# Patient Record
Sex: Male | Born: 1960
Health system: Southern US, Community
[De-identification: ages and names within clinical notes are randomized; demographics above are authoritative.]

## PROBLEM LIST (undated history)

## (undated) DIAGNOSIS — G4730 Sleep apnea, unspecified: Secondary | ICD-10-CM

## (undated) DIAGNOSIS — E079 Disorder of thyroid, unspecified: Secondary | ICD-10-CM

## (undated) DIAGNOSIS — F329 Major depressive disorder, single episode, unspecified: Secondary | ICD-10-CM

## (undated) DIAGNOSIS — F419 Anxiety disorder, unspecified: Secondary | ICD-10-CM

## (undated) DIAGNOSIS — R079 Chest pain, unspecified: Secondary | ICD-10-CM

## (undated) DIAGNOSIS — F32A Depression, unspecified: Secondary | ICD-10-CM

## (undated) HISTORY — DX: Anxiety disorder, unspecified: F41.9

## (undated) HISTORY — DX: Disorder of thyroid, unspecified: E07.9

## (undated) HISTORY — DX: Depression, unspecified: F32.A

## (undated) HISTORY — DX: Chest pain, unspecified: R07.9

## (undated) HISTORY — DX: Major depressive disorder, single episode, unspecified: F32.9

## (undated) HISTORY — DX: Sleep apnea, unspecified: G47.30

---

## 1993-01-30 HISTORY — PX: VARICOSE VEIN SURGERY: SHX832

## 2003-01-31 HISTORY — PX: ENDOSCOPIC VEIN LASER TREATMENT: SHX1508

## 2004-01-31 HISTORY — PX: APPENDECTOMY: SHX54

## 2008-01-31 HISTORY — PX: ELBOW SURGERY: SHX618

## 2009-01-30 HISTORY — PX: SHOULDER SURGERY: SHX246

## 2015-07-31 DEATH — deceased

## 2016-04-06 ENCOUNTER — Other Ambulatory Visit: Payer: Self-pay | Admitting: Physician Assistant

## 2016-04-06 ENCOUNTER — Ambulatory Visit (INDEPENDENT_AMBULATORY_CARE_PROVIDER_SITE_OTHER): Payer: Federal, State, Local not specified - PPO | Admitting: Physician Assistant

## 2016-04-06 ENCOUNTER — Encounter: Payer: Self-pay | Admitting: Physician Assistant

## 2016-04-06 VITALS — BP 122/80 | HR 77 | Temp 97.9°F | Resp 18 | Ht 70.0 in | Wt 270.0 lb

## 2016-04-06 DIAGNOSIS — E559 Vitamin D deficiency, unspecified: Secondary | ICD-10-CM

## 2016-04-06 DIAGNOSIS — E349 Endocrine disorder, unspecified: Secondary | ICD-10-CM

## 2016-04-06 DIAGNOSIS — E039 Hypothyroidism, unspecified: Secondary | ICD-10-CM

## 2016-04-06 DIAGNOSIS — E538 Deficiency of other specified B group vitamins: Secondary | ICD-10-CM

## 2016-04-06 DIAGNOSIS — L719 Rosacea, unspecified: Secondary | ICD-10-CM

## 2016-04-06 DIAGNOSIS — F988 Other specified behavioral and emotional disorders with onset usually occurring in childhood and adolescence: Secondary | ICD-10-CM

## 2016-04-06 DIAGNOSIS — F411 Generalized anxiety disorder: Secondary | ICD-10-CM | POA: Diagnosis not present

## 2016-04-06 DIAGNOSIS — Z7689 Persons encountering health services in other specified circumstances: Secondary | ICD-10-CM

## 2016-04-06 LAB — TSH: TSH: 2.66 mIU/L (ref 0.40–4.50)

## 2016-04-06 LAB — CBC WITH DIFFERENTIAL/PLATELET
Basophils Absolute: 180 cells/uL (ref 0–200)
Basophils Relative: 3 %
Eosinophils Absolute: 240 cells/uL (ref 15–500)
Eosinophils Relative: 4 %
HCT: 46.8 % (ref 38.5–50.0)
Hemoglobin: 15.8 g/dL (ref 13.0–17.0)
Lymphocytes Relative: 50 %
Lymphs Abs: 3000 cells/uL (ref 850–3900)
MCH: 30.9 pg (ref 27.0–33.0)
MCHC: 33.8 g/dL (ref 32.0–36.0)
MCV: 91.6 fL (ref 80.0–100.0)
MPV: 11.1 fL (ref 7.5–12.5)
Monocytes Absolute: 660 cells/uL (ref 200–950)
Monocytes Relative: 11 %
Neutro Abs: 1920 cells/uL (ref 1500–7800)
Neutrophils Relative %: 32 %
Platelets: 230 10*3/uL (ref 140–400)
RBC: 5.11 MIL/uL (ref 4.20–5.80)
RDW: 15 % (ref 11.0–15.0)
WBC: 6 10*3/uL (ref 3.8–10.8)

## 2016-04-06 LAB — VITAMIN B12: Vitamin B-12: 511 pg/mL (ref 200–1100)

## 2016-04-06 LAB — PSA: PSA: 0.4 ng/mL (ref <=4.0)

## 2016-04-06 LAB — LIPID PANEL
Cholesterol: 146 mg/dL (ref <200)
HDL: 36 mg/dL — ABNORMAL LOW (ref >40)
LDL Cholesterol: 89 mg/dL (ref <100)
Total CHOL/HDL Ratio: 4.1 Ratio (ref <5.0)
Triglycerides: 107 mg/dL (ref <150)
VLDL: 21 mg/dL (ref <30)

## 2016-04-06 MED ORDER — AMPHETAMINE-DEXTROAMPHETAMINE 20 MG PO TABS: 20.0000 mg | ORAL_TABLET | Freq: Three times a day (TID) | ORAL | 0 refills | Status: DC

## 2016-04-06 MED ORDER — METRONIDAZOLE 1 % EX GEL: Freq: Every day | CUTANEOUS | 0 refills | Status: DC

## 2016-04-06 MED ORDER — CLONAZEPAM 0.5 MG PO TABS: 0.5000 mg | ORAL_TABLET | Freq: Three times a day (TID) | ORAL | 2 refills | Status: DC | PRN

## 2016-04-06 NOTE — Progress Notes (Signed)
Patient ID: Luis Randall MRN: [id number], DOB: Jul 03, 1960, 56 y.o. Date of Encounter: @DATE @  Chief Complaint:  Chief Complaint  Patient presents with  . New Patient (Initial Visit)    HPI: 56 y.o. year old male  presents as a New Patient to Establish Care.   States that he recently relocated here from [place] in mid December. 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 Retired from that and now [employer] job here now working her [place].  Reports that when he left [place] his doctor there gave him 3 months of prescriptions to hold him over until he established here.  Says in the past he went to a specialist about his thyroid. He had been on Synthroid for years and the specialist changed to the Armour. Afte that,the PCP was doing the labs and the refills on the Armour.  Says the Adderall was started around 2007 and has been prescribed by his PCP. 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  Takes the Klonopin as needed. Says that some days he uses none and  some days he has to use 3 per day -- it just depends.  With the testosterone injections-- says that he was doing the injections at the PCP office but then he started doing them himself. Reports the last injection was in January.   Says that he forgot to tell the nurse about B12 but that he had been on B12 injections once a month  Wants to recheck the B12 levels and testosterone levels today off of therapy prior to sending in those prescriptions. Also says that he has enough Armour thyroid right now and will wait to get those lab results prior to getting that prescription as well.  He states that he has noticed this rash on his cheeks a while now. He drinks no alcohol.  He has no other concerns  to address today.    Past Medical History:  Diagnosis Date  . Anxiety   . Depression   . Sleep apnea   . Thyroid disease      Home Meds: No outpatient prescriptions prior to visit.   No facility-administered medications prior to visit.      Allergies: No Known Allergies  Social History   Social History  . Marital status: Legally Separated    Spouse name: N/A  . Number of children: N/A  . Years of education: N/A   Occupational History  . Not on file.   Social History Main Topics  . Smoking status: Never Smoker  . Smokeless tobacco: Never Used  . Alcohol use No  . Drug use: No  . Sexual activity: Yes   Other Topics Concern  . Not on file   Social History Narrative  . No narrative on file    Family History  Problem Relation Age of Onset  . Arthritis Mother   . Varicose Veins Mother   . Alcohol abuse Father      Review of Systems:  See HPI for pertinent ROS. All other ROS negative.    Physical Exam: Blood pressure 122/80, pulse 77, temperature 97.9 F (36.6 C), temperature source Oral, resp. rate 18, height 5\' 10"  (1.778 m), weight 270 lb (122.5 kg), SpO2 97 %.,  Body mass index is 38.74 kg/m. General: WM. Appears in no acute distress. Neck: Supple. No thyromegaly. No lymphadenopathy. No carotid bruits. Lungs: Clear bilaterally to auscultation without wheezes, rales, or rhonchi. Breathing is unlabored. Heart: RRR with S1 S2. No murmurs, rubs, or gallops. Musculoskeletal:  Strength and tone normal for age. Skin: Cheeks of face, bilaterally--- ~ 1 inch diameter area of diffuse erythema with some papules.  Neuro: Alert and oriented X 3. Moves all extremities spontaneously. Gait is normal. CNII-XII grossly in tact. Psych:  Responds to questions appropriately with a normal affect.     ASSESSMENT AND PLAN:  56 y.o. year old male with  1. Encounter to establish care  2. Hypothyroidism, unspecified type - TSH  3. Attention deficit disorder (ADD)  without hyperactivity I printed 3 prescriptions. One to be filled now, one that states do not fill until 05/07/2016, and 1 that states do not fill until 06/06/2016 - amphetamine-dextroamphetamine (ADDERALL) 20 MG tablet; Take 1 tablet (20 mg total) by mouth 3 (three) times daily.  Dispense: 90 tablet; Refill: 0  4. Testosterone deficiency - CBC with Differential/Platelet - COMPLETE METABOLIC PANEL WITH GFR - Lipid panel - PSA - Testosterone  5. Generalized anxiety disorder - clonazePAM (KLONOPIN) 0.5 MG tablet; Take 1 tablet (0.5 mg total) by mouth 3 (three) times daily as needed for anxiety.  Dispense: 90 tablet; Refill: 2  6. Vitamin D deficiency - VITAMIN D 25 Hydroxy (Vit-D Deficiency, Fractures)  7. B12 deficiency - Vitamin B12  8. Rosacea - metroNIDAZOLE (METROGEL) 1 % gel; Apply topically daily.  Dispense: 45 g; Refill: 0   Follow up with him when I get lab results.   Will plan for routine follow-up visit in 3 months. Follow-up sooner if indicated by lab results or if needed.  Signed, [address] [place], [place], [facility] 04/06/2016 1:40 PM

## 2016-04-07 LAB — VITAMIN D 25 HYDROXY (VIT D DEFICIENCY, FRACTURES): Vit D, 25-Hydroxy: 34 ng/mL (ref 30–100)

## 2016-04-07 LAB — TESTOSTERONE: Testosterone: 286 ng/dL (ref 250–827)

## 2016-04-10 LAB — COMPREHENSIVE METABOLIC PANEL
ALT: 35 U/L (ref 9–46)
AST: 20 U/L (ref 10–35)
Albumin: 3.9 g/dL (ref 3.6–5.1)
Alkaline Phosphatase: 62 U/L (ref 40–115)
BUN: 17 mg/dL (ref 7–25)
CO2: 26 mmol/L (ref 20–31)
Calcium: 9.1 mg/dL (ref 8.6–10.3)
Chloride: 107 mmol/L (ref 98–110)
Creat: 1.01 mg/dL (ref 0.70–1.33)
Glucose, Bld: 93 mg/dL (ref 70–99)
Potassium: 4.4 mmol/L (ref 3.5–5.3)
Sodium: 140 mmol/L (ref 135–146)
Total Bilirubin: 1.6 mg/dL — ABNORMAL HIGH (ref 0.2–1.2)
Total Protein: 6.9 g/dL (ref 6.1–8.1)

## 2016-04-11 ENCOUNTER — Other Ambulatory Visit: Payer: Self-pay

## 2016-04-11 MED ORDER — CHOLECALCIFEROL 125 MCG (5000 UT) PO TABS: 5000.0000 [IU] | ORAL_TABLET | ORAL | 3 refills | Status: DC

## 2016-04-11 MED ORDER — TESTOSTERONE CYPIONATE 200 MG/ML IM SOLN: 200.0000 mg | INTRAMUSCULAR | 5 refills | Status: DC

## 2016-04-18 ENCOUNTER — Telehealth: Payer: Self-pay

## 2016-04-18 MED ORDER — VITAMIN D (ERGOCALCIFEROL) 1.25 MG (50000 UNIT) PO CAPS: 50000.0000 [IU] | ORAL_CAPSULE | ORAL | 3 refills | Status: DC

## 2016-04-18 NOTE — Telephone Encounter (Signed)
Pharmacy called and stated when patient went to pick his prescription up that it was the wrong dose and that he was taking 50,000 units a week and not 5.000 units . Patient was a new patient and verbally provided medications he was taking.   I changed the Rx from 5,000 to 50,000 units once weekly

## 2016-04-26 ENCOUNTER — Telehealth: Payer: Self-pay

## 2016-04-26 NOTE — Telephone Encounter (Signed)
Patient is a new patient and was seen on 04/06/16. Patient states he is taking Armour thyroid medication 90mg  patient was calling to see if you were going to keep him in this and if so Is it okay for me to give him a refill?

## 2016-04-26 NOTE — Telephone Encounter (Signed)
Yes. At lab 04/06/16 TSH was normal. Continue current dose of Armour Thyroid. Can send in refills to last 6 months.

## 2016-04-27 MED ORDER — THYROID 90 MG PO TABS: 90.0000 mg | ORAL_TABLET | Freq: Every day | ORAL | 3 refills | Status: DC

## 2016-04-27 NOTE — Telephone Encounter (Signed)
Rx sent to pharmacy   

## 2016-05-04 ENCOUNTER — Other Ambulatory Visit: Payer: Self-pay | Admitting: Physician Assistant

## 2016-05-04 DIAGNOSIS — F988 Other specified behavioral and emotional disorders with onset usually occurring in childhood and adolescence: Secondary | ICD-10-CM

## 2016-05-19 ENCOUNTER — Telehealth: Payer: Self-pay

## 2016-05-19 NOTE — Telephone Encounter (Signed)
Patient was seen in the office on 04/06/16 as a new patient and states he forgot to provide one of the medications he was taking which is phentermine 37.5 mg when asked when the  medication was prescribed patient stated 11/11/2015.  Patient is asking for a new prescription.  Pls advise

## 2016-05-22 NOTE — Telephone Encounter (Signed)
He is already taking Adderall for ADD. I do not recommend taking these medications together.  Refill for Phentermine  denied.

## 2016-05-22 NOTE — Telephone Encounter (Signed)
Spoke with patient regarding phentermine and he understands why the Rx can not be filled

## 2016-07-10 ENCOUNTER — Ambulatory Visit: Payer: Federal, State, Local not specified - PPO | Admitting: Physician Assistant

## 2016-07-13 ENCOUNTER — Encounter: Payer: Self-pay | Admitting: Physician Assistant

## 2016-07-13 ENCOUNTER — Ambulatory Visit (INDEPENDENT_AMBULATORY_CARE_PROVIDER_SITE_OTHER): Payer: Federal, State, Local not specified - PPO | Admitting: Physician Assistant

## 2016-07-13 VITALS — BP 120/78 | HR 81 | Temp 98.1°F | Resp 18 | Wt 264.2 lb

## 2016-07-13 DIAGNOSIS — F900 Attention-deficit hyperactivity disorder, predominantly inattentive type: Secondary | ICD-10-CM | POA: Diagnosis not present

## 2016-07-13 DIAGNOSIS — E559 Vitamin D deficiency, unspecified: Secondary | ICD-10-CM

## 2016-07-13 DIAGNOSIS — F411 Generalized anxiety disorder: Secondary | ICD-10-CM

## 2016-07-13 DIAGNOSIS — R7301 Impaired fasting glucose: Secondary | ICD-10-CM | POA: Diagnosis not present

## 2016-07-13 DIAGNOSIS — F988 Other specified behavioral and emotional disorders with onset usually occurring in childhood and adolescence: Secondary | ICD-10-CM

## 2016-07-13 DIAGNOSIS — M542 Cervicalgia: Secondary | ICD-10-CM | POA: Diagnosis not present

## 2016-07-13 LAB — HEMOGLOBIN A1C, FINGERSTICK: Hgb A1C (fingerstick): 5.6 % (ref <5.7)

## 2016-07-13 MED ORDER — AMPHETAMINE-DEXTROAMPHETAMINE 20 MG PO TABS: 20.0000 mg | ORAL_TABLET | Freq: Three times a day (TID) | ORAL | 0 refills | Status: DC

## 2016-07-13 MED ORDER — TIZANIDINE HCL 4 MG PO TABS: 4.0000 mg | ORAL_TABLET | Freq: Three times a day (TID) | ORAL | 2 refills | Status: DC | PRN

## 2016-07-13 MED ORDER — CLONAZEPAM 0.5 MG PO TABS: 0.5000 mg | ORAL_TABLET | Freq: Three times a day (TID) | ORAL | 2 refills | Status: DC | PRN

## 2016-07-13 MED ORDER — VITAMIN D3 125 MCG (5000 UT) PO TABS: 1.0000 | ORAL_TABLET | Freq: Every day | ORAL | 5 refills | Status: DC

## 2016-07-13 NOTE — Progress Notes (Signed)
Patient ID: Luis Randall MRN: [id number], DOB: March 10, 1960, 56 y.o. Date of Encounter: @DATE @  Chief Complaint:  Chief Complaint  Patient presents with  . dicuss medications    HPI: 56 y.o. year old male     04/06/2016: presents as a New Patient to Establish Care.   States that he recently relocated here from [place] in mid December. 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 Retired from that and now [employer] job here [place] working [place].  Reports that when he left [place] his doctor there gave him 3 months of prescriptions to hold him over until he established here.  Says in the past he went to a specialist about his thyroid. He had been on Synthroid for years and the specialist changed to the Armour. Afte that,the PCP was doing the labs and the refills on the Armour.  Says the Adderall was started around 2007 and has been prescribed by his PCP. 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  Takes the Klonopin as needed. Says that some days he uses none and  some days he has to use 3 per day -- it just depends.  With the testosterone injections-- says that he was doing the injections at the PCP office but then he started doing them himself. Reports the last injection was in January.   Says that he forgot to tell the nurse about B12 but that he had been on B12 injections once a month  Wants to recheck the B12 levels and testosterone levels today off of therapy prior to sending in those prescriptions. Also says that he has enough Armour thyroid right now and will wait to get those lab results prior to getting that prescription as well.  He states that he has noticed this rash on his cheeks a while now. He drinks no alcohol.  He has no other  concerns to address today.   07/13/2016: He reports that he recently was in [place] and had a complete physical exam performed while he was there. Says that fasting lab showed glucose 120. Says that TSH came back elevated. Says that at that visit they prescribed Adipex to help with weight loss given his high glucose.  Told him he could not take Adderall with that so discontinued his Adderall.   He shows me 2 prescription bottles of medications that were added at that visit that he wants me to be well aware of. Also is requesting refills of these. --Phentermine  58 mg 1 daily. He says that was prescribed to help him lose weight given his high glucose. --Tizanadine 4 mg says this was prescribed "for disc in back ".  He also shows me to other prescription bottles and says that he needs refills on those: --Vitamin D 50,000 units 1 once weekly for 12 weeks--- he states that he has taken the 12 of those. --Klonopin 0.5 mg 1 twice a day prn --- needs refill on this one.  Discussed the abnormal TSH level further--- he states that when we called with our lab results from his OV, labs here 04/06/16 -- and said that the labs were normal-- that he thought that meant he could stop the thyroid medication-- so he stopped the  Armour Thyroid at that time. Today I have explained to him that that is why the recent TSH is reading high. I apologized for the miscommunication but explained that given that the lab level came back    Past Medical History:  Diagnosis Date  . Anxiety   . Depression   . Sleep apnea   . Thyroid disease      Home Meds: Outpatient Medications Prior to Visit  Medication Sig Dispense Refill  . amphetamine-dextroamphetamine (ADDERALL) 20 MG tablet Take 1 tablet (20 mg total) by mouth 3 (three) times daily. 90 tablet 0  . metroNIDAZOLE (METROGEL) 1 % gel Apply topically daily. 45 g 0  . testosterone cypionate (DEPO-TESTOSTERONE) 200 MG/ML injection Inject 1 mL (200 mg total) into the  muscle every 14 (fourteen) days. Inject 1 ml intramuscular  every two weeks. 10 mL 5  . thyroid (ARMOUR THYROID) 90 MG tablet Take 1 tablet (90 mg total) by mouth daily. 90 tablet 3  . clonazePAM (KLONOPIN) 0.5 MG tablet Take 1 tablet (0.5 mg total) by mouth 3 (three) times daily as needed for anxiety. 90 tablet 2  . Vitamin D, Ergocalciferol, (DRISDOL) 50000 units CAPS capsule Take 1 capsule (50,000 Units total) by mouth every 7 (seven) days. 30 capsule 3   No facility-administered medications prior to visit.      Allergies: No Known Allergies  Social History   Social History  . Marital status: Legally Separated    Spouse name: N/A  . Number of children: N/A  . Years of education: N/A   Occupational History  . Not on file.   Social History Main Topics  . Smoking status: Never Smoker  . Smokeless tobacco: Never Used  . Alcohol use No  . Drug use: No  . Sexual activity: Yes   Other Topics Concern  . Not on file   Social History Narrative  . No narrative on file    Family History  Problem Relation Age of Onset  . Arthritis Mother   . Varicose Veins Mother   . Alcohol abuse Father      Review of Systems:  See HPI for pertinent ROS. All other ROS negative.    Physical Exam: Blood pressure 120/78, pulse 81, temperature 98.1 F (36.7 C), temperature source Oral, resp. rate 18, weight 264 lb 3.2 oz (119.8 kg), SpO2 99 %., Body mass index is 37.91 kg/m. General: WM. Appears in no acute distress. Neck: Supple. No thyromegaly. No lymphadenopathy. No carotid bruits. Lungs: Clear bilaterally to auscultation without wheezes, rales, or rhonchi. Breathing is unlabored. Heart: RRR with S1 S2. No murmurs, rubs, or gallops. Musculoskeletal:  Strength and tone normal for age. Neuro: Alert and oriented X 3. Moves all extremities spontaneously. Gait is normal. CNII-XII grossly in tact. Psych:  Responds to questions appropriately with a normal affect.     ASSESSMENT AND PLAN:    56 y.o. year old male with   1. Fasting hyperglycemia - Hemoglobin A1C, fingerstick A1C Was run while patient here in the office psych get those results to discuss with him. A1c is 5.6. Discussed with him that this is normal.   2. Generalized anxiety disorder I will give refills for him to use the Klonopin as needed. - clonazePAM (KLONOPIN) 0.5 MG tablet; Take 1 tablet (0.5 mg total) by mouth 3 (three) times daily as needed for anxiety.  Dispense: 90 tablet; Refill: 2  3. Vitamin D deficiency Lab 04/06/16 vitamin D level was low. He subsequently took the prescription  strength 50,000 units weekly for 12 weeks. At this time I will have him start taking over-the-counter vitamin D at 5,000 units daily and will recheck this level at next lab draw at next routine visit.  4. Neck pain I have refilled that type tizanidine to use as needed.  6. Attention deficit disorder (ADD) without hyperactivity Have told him to discontinue the phentermine. I have printed refills on his Adderall. One can be filled now. One is  For 08/12/16, and for 09/12/16. - amphetamine-dextroamphetamine (ADDERALL) 20 MG tablet; Take 1 tablet (20 mg total) by mouth 3 (three) times daily.  Dispense: 90 tablet; Refill: 0  7. Hypothyroidism, unspecified type He is to resume his prior dose of Armour Thyroid. He will then return to recheck TSH in 6 weeks. - TSH   -------------------------------THE FOLLOWING IS COPIED FROM HIS OV NOTE 04/06/2016 BUT IS NOT ADDRESSED AT HIS OV 07/13/2016----------------------------------  Testosterone deficiency - CBC with Differential/Platelet - COMPLETE METABOLIC PANEL WITH GFR - Lipid panel - PSA - Testosterone  B12 deficiency - Vitamin B12  Rosacea - metroNIDAZOLE (METROGEL) 1 % gel; Apply topically daily.  Dispense: 45 g; Refill: 0   He is going to go ahead and schedule a follow-up office visit in 6 weeks when his follow-up TSH is due. I will recheck vitamin D level at the time that I  do that lab.  Note that he had did have complete physical exam performed in [place] recently so we'll need to wait full year prior to repeating CPE/preventive care.   Murray Hodgkins [place], [place], [facility] 07/13/2016 3:17 PM

## 2016-09-07 ENCOUNTER — Ambulatory Visit: Payer: Federal, State, Local not specified - PPO | Admitting: Physician Assistant

## 2016-09-07 ENCOUNTER — Encounter: Payer: Self-pay | Admitting: Physician Assistant

## 2016-09-14 ENCOUNTER — Ambulatory Visit (INDEPENDENT_AMBULATORY_CARE_PROVIDER_SITE_OTHER): Payer: Federal, State, Local not specified - PPO | Admitting: Physician Assistant

## 2016-09-14 ENCOUNTER — Encounter: Payer: Self-pay | Admitting: Physician Assistant

## 2016-09-14 VITALS — BP 122/80 | HR 79 | Temp 97.8°F | Resp 16 | Ht 70.0 in | Wt 263.0 lb

## 2016-09-14 DIAGNOSIS — E039 Hypothyroidism, unspecified: Secondary | ICD-10-CM

## 2016-09-14 DIAGNOSIS — R632 Polyphagia: Secondary | ICD-10-CM | POA: Diagnosis not present

## 2016-09-14 DIAGNOSIS — Z1211 Encounter for screening for malignant neoplasm of colon: Secondary | ICD-10-CM

## 2016-09-14 DIAGNOSIS — Z1212 Encounter for screening for malignant neoplasm of rectum: Secondary | ICD-10-CM

## 2016-09-14 DIAGNOSIS — F988 Other specified behavioral and emotional disorders with onset usually occurring in childhood and adolescence: Secondary | ICD-10-CM

## 2016-09-14 DIAGNOSIS — F411 Generalized anxiety disorder: Secondary | ICD-10-CM

## 2016-09-14 LAB — TSH: TSH: 7.31 mIU/L — ABNORMAL HIGH (ref 0.40–4.50)

## 2016-09-14 MED ORDER — PHENTERMINE HCL 37.5 MG PO CAPS: 37.5000 mg | ORAL_CAPSULE | ORAL | 0 refills | Status: DC

## 2016-09-14 MED ORDER — CLONAZEPAM 0.5 MG PO TABS: 0.5000 mg | ORAL_TABLET | Freq: Three times a day (TID) | ORAL | 2 refills | Status: DC | PRN

## 2016-09-14 MED ORDER — TESTOSTERONE CYPIONATE 200 MG/ML IM SOLN: 200.0000 mg | INTRAMUSCULAR | 5 refills | Status: DC

## 2016-09-14 MED ORDER — TIZANIDINE HCL 4 MG PO TABS: 4.0000 mg | ORAL_TABLET | Freq: Three times a day (TID) | ORAL | 2 refills | Status: DC | PRN

## 2016-09-14 NOTE — Progress Notes (Signed)
Patient ID: Luis PillarJeffrey H Viscomi MRN: [id number], DOB: [birth year], 56 y.o. Date of Encounter: @DATE @  Chief Complaint:  Chief Complaint  Patient presents with  . 3 month follow up    HPI: 56 y.o. year old male     04/06/2016: presents as a New Patient to Establish Care.   States that he recently relocated here from [place] in mid December. 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 Retired from that and now [employer] job here now [place].  Reports that when he left [place] his doctor there gave him 3 months of prescriptions to hold him over until he established here.  Says in the past he went to a specialist about his thyroid. He had been on Synthroid for years and the specialist changed to the Armour. Afte that,the PCP was doing the labs and the refills on the Armour.  Says the Adderall was started around 2007 and has been prescribed by his PCP. 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  Takes the Klonopin as needed. Says that some days he uses none and  some days he has to use 3 per day -- it just depends.  With the testosterone injections-- says that he was doing the injections at the PCP office but then he started doing them himself. Reports the last injection was in January.   Says that he forgot to tell the nurse about B12 but that he had been on B12 injections once a month  Wants to recheck the B12 levels and testosterone levels today off of therapy prior to sending in those prescriptions. Also says that he has enough Armour thyroid right now and will wait to get those lab results prior to getting that prescription as well.  He states that he has noticed this rash on his cheeks a while now. He drinks no alcohol.  He has no other  concerns to address today.   07/13/2016: He reports that he recently was in [place] and had a complete physical exam performed while he was there. Says that fasting lab showed glucose 120. Says that TSH came back elevated. Says that at that visit they prescribed Adipex to help with weight loss given his high glucose.  Told him he could not take Adderall with that so discontinued his Adderall.   He shows me 2 prescription bottles of medications that were added at that visit that he wants me to be well aware of. Also is requesting refills of these. --Phentermine  37.5 mg 1 daily. He says that was prescribed to help him lose weight given his high glucose. --Tizanadine 4 mg says this was prescribed "for disc in back ".  He also shows me to other prescription bottles and says that he needs refills on those: --Vitamin D 50,000 units 1 once weekly for 12 weeks--- he states that he has taken the 12 of those. --Klonopin 0.5 mg 1 twice a day prn --- needs refill on this one.  Discussed the abnormal TSH level further--- he states that when we called with our lab results from his OV, labs here 04/06/16 -- and said that the labs were normal-- that he thought that meant he could stop the thyroid medication-- so he  stopped the Armour Thyroid at that time. Today I have explained to him that that is why the recent TSH is reading high. I apologized for the miscommunication but explained that given that the lab level came back   09/14/2016:  One thing he wanted to address today was getting referral for colonoscopy. States that when he was in [place] and went for physical there, they were going to refer him for this but he told them that he had moved to [place]. He reports that he has never had a colonoscopy. He is agreeable to have this. Told him that I will place order for this referral today.  The second thing that staff told me he wanted to address today was phentermine --and that if he can't use  phentermine and Adderall together, then he would prefer to use the phentermine. When I initially brought up the subject, discussed that while people are on medicine it does help decrease appetite but when not taking the medicine things will return to the way they had been prior to medicine unless we address the underlying cause of the binge eating. He then discusses that when he was living in [place] (seeing prior medical provider there) he was severely depressed and that's when the binge eating started. Says that when he was in [place] he was diagnosed with chronic fatigue syndrome and severe depression. Says that he has been on multiple medications and has seen multiple specialists. Says that they were considering transcranial magnetic stimulation TCM MS but that it was very expensive and he did not want to put himself under that type of financial burden. Says "you name the medicine, I've been on it ". Says that he also did meet with therapist routinely for a while. Says that it was in 2004 that he went through a nasty divorce. Says that it was 2004 when all of this started. Says it was also 2004 that his thyroid became abnormal. Says that he felt trapped with that marriage. Says that he hated his job and he hated going home. Says that at that time he just wanted out and just wanted the divorce and says that now he realizes that he "got raked under the Coals" --financially and in all aspects. Makes mention of child support etc. Says that also prior to that he was using pot etc. but he quit that in 2004 and changed his lifestyle/choices.  Says that he has a live-in girlfriend now.  States that the company/job that he had in [place]--- based out of [place] so they were able to move him to [place] with high pay. At the time he thought that it actually would be a good idea. He was raised in [place] and has family around here so thought it would be good to reconnect with them. However now is not sure  that it was a good idea. Says he has a daughter that lives in [place] and his son lives in [place]. His son is 40 and has made some bad choices and he feels like he needs to be there to help him. Says that at this point he feels like this---  the fact that he has moved here-- may be part of underlying issues prompting this binge eating.  Says that for example just recently he and his girlfriend went for a walk then went and ate at [employer]. Right after that-- he went to a drive-through and ordered a double cheeseburger combo in addition to second double cheeseburger. Ate all of it.  Right after he had just eaten [place].  Knows that he couldn't have been very hungry. Even says "I dont even like burgers" but says he cannot control that type of binge eating.  Says that he can do good with walking a lot eating good then will binge eat.  Also wants to recheck thyroid lab. See LOV note.   Past Medical History:  Diagnosis Date  . Anxiety   . Depression   . Sleep apnea   . Thyroid disease      Home Meds: Outpatient Medications Prior to Visit  Medication Sig Dispense Refill  . amphetamine-dextroamphetamine (ADDERALL) 20 MG tablet Take 1 tablet (20 mg total) by mouth 3 (three) times daily. 90 tablet 0  . Cholecalciferol (VITAMIN D3) 5000 units TABS Take 1 tablet (5,000 Units total) by mouth daily. 30 tablet 5  . metroNIDAZOLE (METROGEL) 1 % gel Apply topically daily. 45 g 0  . thyroid (ARMOUR THYROID) 90 MG tablet Take 1 tablet (90 mg total) by mouth daily. 90 tablet 3  . clonazePAM (KLONOPIN) 0.5 MG tablet Take 1 tablet (0.5 mg total) by mouth 3 (three) times daily as needed for anxiety. 90 tablet 2  . testosterone cypionate (DEPO-TESTOSTERONE) 200 MG/ML injection Inject 1 mL (200 mg total) into the muscle every 14 (fourteen) days. Inject 1 ml intramuscular  every two weeks. 10 mL 5  . tiZANidine (ZANAFLEX) 4 MG tablet Take 1 tablet (4 mg total) by mouth every 8 (eight) hours as needed for muscle  spasms. 60 tablet 2   No facility-administered medications prior to visit.      Allergies: No Known Allergies  Social History   Social History  . Marital status: Legally Separated    Spouse name: N/A  . Number of children: N/A  . Years of education: N/A   Occupational History  . Not on file.   Social History Main Topics  . Smoking status: Never Smoker  . Smokeless tobacco: Never Used  . Alcohol use No  . Drug use: No  . Sexual activity: Yes   Other Topics Concern  . Not on file   Social History Narrative  . No narrative on file    Family History  Problem Relation Age of Onset  . Arthritis Mother   . Varicose Veins Mother   . Alcohol abuse Father      Review of Systems:  See HPI for pertinent ROS. All other ROS negative.    Physical Exam: Blood pressure 122/80, pulse 79, temperature 97.8 F (36.6 C), temperature source Oral, resp. rate 16, height 5\' 10"  (1.778 m), weight 263 lb (119.3 kg), SpO2 97 %., Body mass index is 37.74 kg/m. General: WM. Appears in no acute distress. Neck: Supple. No thyromegaly. No lymphadenopathy. No carotid bruits. Lungs: Clear bilaterally to auscultation without wheezes, rales, or rhonchi. Breathing is unlabored. Heart: RRR with S1 S2. No murmurs, rubs, or gallops. Musculoskeletal:  Strength and tone normal for age. Neuro: Alert and oriented X 3. Moves all extremities spontaneously. Gait is normal. CNII-XII grossly in tact. Psych:  Responds to questions appropriately with a normal affect.     ASSESSMENT AND PLAN:  56 y.o. year old male with    1. Screening for colorectal cancer - Ambulatory referral to Gastroenterology  2. Binge eating Today I printed to prescriptions for this. One can be filled now. The second one states "do not fill until 10/15/2016" - phentermine 37.5 MG capsule; Take 1 capsule (37.5 mg total) by mouth every morning.  Dispense: 30 capsule; Refill: 0  3. Attention deficit disorder (ADD) without  hyperactivity He is to stop the Adderall and knows not to take this while on the phentermine.  4. Hypothyroidism, unspecified type Will recheck TSH to monitor. - TSH   Will have him schedule follow-up office visit in 2 months. Follow-up sooner if needed.     --------------------THE FOLLOWING IS COPIED FROM OV 07/13/2016--NOT ADDRESSED AT OV 09/14/2016------------------- 1. Fasting hyperglycemia - Hemoglobin A1C, fingerstick A1C Was run while patient here in the office psych get those results to discuss with him. A1c is 5.6. Discussed with him that this is normal.   2. Generalized anxiety disorder I will give refills for him to use the Klonopin as needed. - clonazePAM (KLONOPIN) 0.5 MG tablet; Take 1 tablet (0.5 mg total) by mouth 3 (three) times daily as needed for anxiety.  Dispense: 90 tablet; Refill: 2  3. Vitamin D deficiency Lab 04/06/16 vitamin D level was low. He subsequently took the prescription strength 50,000 units weekly for 12 weeks. At this time I will have him start taking over-the-counter vitamin D at 5,000 units daily and will recheck this level at next lab draw at next routine visit.  4. Neck pain I have refilled that type tizanidine to use as needed.  6. Attention deficit disorder (ADD) without hyperactivity Have told him to discontinue the phentermine. I have printed refills on his Adderall. One can be filled now. One is  For 08/12/16, and for 09/12/16. - amphetamine-dextroamphetamine (ADDERALL) 20 MG tablet; Take 1 tablet (20 mg total) by mouth 3 (three) times daily.  Dispense: 90 tablet; Refill: 0  7. Hypothyroidism, unspecified type He is to resume his prior dose of Armour Thyroid. He will then return to recheck TSH in 6 weeks. - TSH   -------------------------------THE FOLLOWING IS COPIED FROM HIS OV NOTE 04/06/2016 BUT IS NOT ADDRESSED AT HIS OV 07/13/2016----------------------------------  Testosterone deficiency - CBC with Differential/Platelet - COMPLETE  METABOLIC PANEL WITH GFR - Lipid panel - PSA - Testosterone  B12 deficiency - Vitamin B12  Rosacea - metroNIDAZOLE (METROGEL) 1 % gel; Apply topically daily.  Dispense: 45 g; Refill: 0   He is going to go ahead and schedule a follow-up office visit in 6 weeks when his follow-up TSH is due. I will recheck vitamin D level at the time that I do that lab.  Note that he had did have complete physical exam performed in [place] recently so we'll need to wait full year prior to repeating CPE/preventive care.   Signed, [address] [place], [place], [facility] 09/14/2016 10:50 AM

## 2016-09-19 ENCOUNTER — Other Ambulatory Visit: Payer: Self-pay

## 2016-09-19 DIAGNOSIS — E038 Other specified hypothyroidism: Secondary | ICD-10-CM

## 2016-09-19 MED ORDER — THYROID 120 MG PO TABS: 120.0000 mg | ORAL_TABLET | Freq: Every day | ORAL | 3 refills | Status: DC

## 2016-09-20 ENCOUNTER — Telehealth: Payer: Self-pay

## 2016-09-20 DIAGNOSIS — F988 Other specified behavioral and emotional disorders with onset usually occurring in childhood and adolescence: Secondary | ICD-10-CM

## 2016-09-20 MED ORDER — AMPHETAMINE-DEXTROAMPHETAMINE 20 MG PO TABS: 20.0000 mg | ORAL_TABLET | Freq: Three times a day (TID) | ORAL | 0 refills | Status: DC

## 2016-09-20 NOTE — Telephone Encounter (Signed)
Patient called and states he no longer wants to do phentermine he has done research and feels that would not be a good idea for him. Patient would like to bring the printed Rx back and get a rx for adderall.  Pls advise

## 2016-09-20 NOTE — Telephone Encounter (Signed)
Spoke with patient he is aware he will need to bring the old rx before we can give the rx for the adderall. Patient states he will pick up between 1030-1100am on 8/23

## 2016-09-20 NOTE — Telephone Encounter (Signed)
Can go ahead and print prescription for Adderall for me to sign. He will need to bring in the prescriptions for phentermine for [place] to shred--- in exchange for the Adderall prescription.

## 2016-09-21 NOTE — Telephone Encounter (Signed)
Patient did bring back the old Rx for the phentermine in exchange for the adderall rx

## 2016-09-28 ENCOUNTER — Telehealth: Payer: Self-pay

## 2016-09-28 NOTE — Telephone Encounter (Signed)
Pt's wife, Thedora HindersSandie, called. He received triage letter. He isn't taking any blood thinners. No GI problems. No heart attack in past 12 months. No alcohol. She can be reached at [phone].   Routing to DS.

## 2016-10-05 ENCOUNTER — Encounter: Payer: Self-pay | Admitting: Physician Assistant

## 2016-10-05 NOTE — Telephone Encounter (Signed)
Vm not set up

## 2016-10-19 ENCOUNTER — Telehealth: Payer: Self-pay

## 2016-10-19 NOTE — Telephone Encounter (Signed)
See previous note

## 2016-10-19 NOTE — Telephone Encounter (Signed)
Pt has ov with Wynne Dust, NP on 12/06/2016 at 9:30 Am due to his meds.

## 2016-11-08 ENCOUNTER — Other Ambulatory Visit: Payer: Self-pay | Admitting: Physician Assistant

## 2016-11-08 DIAGNOSIS — F411 Generalized anxiety disorder: Secondary | ICD-10-CM

## 2016-11-08 NOTE — Telephone Encounter (Signed)
Call placed to pharmacy.   Patient has refill on file.

## 2016-11-08 NOTE — Telephone Encounter (Signed)
Ok to refill 

## 2016-11-08 NOTE — Telephone Encounter (Signed)
It looks like on 09/14/2016-----Rxed---# 90 + 2----is this correct?  If so, he should have filled the original prescription on 8/16 And the additional 2 prescriptions should be for 9/16 and 10/16----so he should already have a refill available---??

## 2016-11-09 ENCOUNTER — Encounter: Payer: Self-pay | Admitting: Physician Assistant

## 2016-11-15 ENCOUNTER — Ambulatory Visit: Payer: Federal, State, Local not specified - PPO | Admitting: Physician Assistant

## 2016-12-06 ENCOUNTER — Ambulatory Visit: Payer: Self-pay | Admitting: Nurse Practitioner

## 2016-12-06 ENCOUNTER — Telehealth: Payer: Self-pay | Admitting: Nurse Practitioner

## 2016-12-06 ENCOUNTER — Encounter: Payer: Self-pay | Admitting: Nurse Practitioner

## 2016-12-06 NOTE — Telephone Encounter (Signed)
PATIENT WAS A NO SHOW AND LETTER SENT  °

## 2016-12-14 NOTE — Telephone Encounter (Signed)
Noted  

## 2017-01-03 ENCOUNTER — Other Ambulatory Visit: Payer: Self-pay | Admitting: Physician Assistant

## 2017-01-04 ENCOUNTER — Other Ambulatory Visit: Payer: Self-pay | Admitting: Physician Assistant

## 2017-01-04 DIAGNOSIS — F411 Generalized anxiety disorder: Secondary | ICD-10-CM

## 2017-01-04 NOTE — Telephone Encounter (Signed)
Patient paid his balance and would like to know if he can get refill on his muscle relaxer, it would need to go to [facility]

## 2017-01-04 NOTE — Telephone Encounter (Signed)
Ok to refill  Last OV 8/16 Last refill 8/16

## 2017-01-04 NOTE — Telephone Encounter (Signed)
Approved. #[id number]. 

## 2017-01-05 MED ORDER — TIZANIDINE HCL 4 MG PO TABS: 4.0000 mg | ORAL_TABLET | Freq: Three times a day (TID) | ORAL | 2 refills | Status: DC | PRN

## 2017-01-05 NOTE — Telephone Encounter (Signed)
rx sent to pharmacy vm left for patient

## 2017-01-11 ENCOUNTER — Other Ambulatory Visit: Payer: Self-pay

## 2017-01-11 ENCOUNTER — Ambulatory Visit (INDEPENDENT_AMBULATORY_CARE_PROVIDER_SITE_OTHER): Payer: Federal, State, Local not specified - PPO | Admitting: Physician Assistant

## 2017-01-11 ENCOUNTER — Encounter: Payer: Self-pay | Admitting: Physician Assistant

## 2017-01-11 VITALS — BP 124/80 | HR 84 | Temp 98.2°F | Resp 16 | Ht 70.0 in | Wt 254.2 lb

## 2017-01-11 DIAGNOSIS — E039 Hypothyroidism, unspecified: Secondary | ICD-10-CM

## 2017-01-11 DIAGNOSIS — E038 Other specified hypothyroidism: Secondary | ICD-10-CM

## 2017-01-11 DIAGNOSIS — F411 Generalized anxiety disorder: Secondary | ICD-10-CM | POA: Diagnosis not present

## 2017-01-11 DIAGNOSIS — F988 Other specified behavioral and emotional disorders with onset usually occurring in childhood and adolescence: Secondary | ICD-10-CM

## 2017-01-11 DIAGNOSIS — E349 Endocrine disorder, unspecified: Secondary | ICD-10-CM

## 2017-01-11 LAB — TSH: TSH: 1.33 mIU/L (ref 0.40–4.50)

## 2017-01-11 MED ORDER — AMPHETAMINE-DEXTROAMPHETAMINE 20 MG PO TABS: 20.0000 mg | ORAL_TABLET | Freq: Three times a day (TID) | ORAL | 0 refills | Status: DC

## 2017-01-11 MED ORDER — TIZANIDINE HCL 4 MG PO TABS: 4.0000 mg | ORAL_TABLET | Freq: Three times a day (TID) | ORAL | 2 refills | Status: DC | PRN

## 2017-01-11 MED ORDER — CLONAZEPAM 0.5 MG PO TABS: 0.5000 mg | ORAL_TABLET | Freq: Three times a day (TID) | ORAL | 2 refills | Status: DC | PRN

## 2017-01-11 MED ORDER — TESTOSTERONE CYPIONATE 200 MG/ML IM SOLN: 200.0000 mg | INTRAMUSCULAR | 5 refills | Status: DC

## 2017-01-11 NOTE — Progress Notes (Signed)
Patient ID: Luis Randall MRN: [id number], DOB: 05-31-1960, 56 y.o. Date of Encounter: @DATE @  Chief Complaint:  No chief complaint on file.   HPI: 56 y.o. year old male     04/06/2016: presents as a New Patient to Establish Care.   States that he recently relocated here from [place] in mid December. 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 Retired from that and now [employer] job here [place].  Reports that when he left [place] his doctor there gave him 3 months of prescriptions to hold him over until he established here.  Says in the past he went to a specialist about his thyroid. He had been on Synthroid for years and the specialist changed to the Armour. Afte that,the PCP was doing the labs and the refills on the Armour.  Says the Adderall was started around 2007 and has been prescribed by his PCP. 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  Takes the Klonopin as needed. Says that some days he uses none and  some days he has to use 3 per day -- it just depends.  With the testosterone injections-- says that he was doing the injections at the PCP office but then he started doing them himself. Reports the last injection was in January.   Says that he forgot to tell the nurse about B12 but that he had been on B12 injections once a month  Wants to recheck the B12 levels and testosterone levels today off of therapy prior to sending in those prescriptions. Also says that he has enough Armour thyroid right now and will wait to get those lab results prior to getting that prescription as well.  He states that he has noticed this rash on his cheeks a while now. He drinks no alcohol.  He has no other concerns to address  today.   07/13/2016: He reports that he recently was in [place] and had a complete physical exam performed while he was there. Says that fasting lab showed glucose 120. Says that TSH came back elevated. Says that at that visit they prescribed Adipex to help with weight loss given his high glucose.  Told him he could not take Adderall with that so discontinued his Adderall.   He shows me 2 prescription bottles of medications that were added at that visit that he wants me to be well aware of. Also is requesting refills of these. --Phentermine  37.5 mg 1 daily. He says that was prescribed to help him lose weight given his high glucose. --Tizanadine 4 mg says this was prescribed "for disc in back ".  He also shows me to other prescription bottles and says that he needs refills on those: --Vitamin D 50,000 units 1 once weekly for 12 weeks--- he states that he has taken the 12 of those. --Klonopin 0.5 mg 1 twice a day prn --- needs refill on this one.  Discussed the abnormal TSH level further--- he states that when we called with our lab results from his OV, labs here 04/06/16 -- and said that the labs were normal-- that he thought that meant he could stop the thyroid medication-- so he stopped the Armour Thyroid at that time. Today  I have explained to him that that is why the recent TSH is reading high. I apologized for the miscommunication but explained that given that the lab level came back   09/14/2016:  One thing he wanted to address today was getting referral for colonoscopy. States that when he was in [place] and went for physical there, they were going to refer him for this but he told them that he had moved to [place]. He reports that he has never had a colonoscopy. He is agreeable to have this. Told him that I will place order for this referral today.  The second thing that staff told me he wanted to address today was phentermine --and that if he can't use phentermine and Adderall  together, then he would prefer to use the phentermine. When I initially brought up the subject, discussed that while people are on medicine it does help decrease appetite but when not taking the medicine things will return to the way they had been prior to medicine unless we address the underlying cause of the binge eating. He then discusses that when he was living in [place] (seeing prior medical provider there) he was severely depressed and that's when the binge eating started. Says that when he was in [place] he was diagnosed with chronic fatigue syndrome and severe depression. Says that he has been on multiple medications and has seen multiple specialists. Says that they were considering transcranial magnetic stimulation TCM MS but that it was very expensive and he did not want to put himself under that type of financial burden. Says "you name the medicine, I've been on it ". Says that he also did meet with therapist routinely for a while. Says that it was in 2004 that he went through a nasty divorce. Says that it was 2004 when all of this started. Says it was also 2004 that his thyroid became abnormal. Says that he felt trapped with that marriage. Says that he hated his job and he hated going home. Says that at that time he just wanted out and just wanted the divorce and says that now he realizes that he "got raked under the Coals" --financially and in all aspects. Makes mention of child support etc. Says that also prior to that he was using pot etc. but he quit that in 2004 and changed his lifestyle/choices.  Says that he has a live-in girlfriend now.  States that the company/job that he had in [place]--- based out of [place] so they were able to move him to [place] with high pay. At the time he thought that it actually would be a good idea. He was raised in [place] and has family around here so thought it would be good to reconnect with them. However now is not sure that it was a good idea.  Says he has a daughter that lives in [place] and his son lives in [place]. His son is 33 and has made some bad choices and he feels like he needs to be there to help him. Says that at this point he feels like this---  the fact that he has moved here-- may be part of underlying issues prompting this binge eating.  Says that for example just recently he and his girlfriend went for a walk then went and ate at [employer]. Right after that-- he went to a drive-through and ordered a double cheeseburger combo in addition to second double cheeseburger. Ate all of it. Right after he had just eaten [place].  Knows that he couldn't have been very hungry. Even says "I dont even like burgers" but says he cannot control that type of binge eating.  Says that he can do good with walking a lot eating good then will binge eat.  Also wants to recheck thyroid lab. See LOV note.    01/11/2017: His last office visit, he called on August 22 stating that he wanted to stop the phentermine and changed back to the Adderall.  That time he did bring back his bottle of phentermine and exchanged for the prescription for Adderall. A he states that he feels that the Adderall is working and plans to continue with this and needs refills. Also is due to recheck lab for thyroid after adjusting that dose.  Is taking her thyroid medication as directed. Reports that he also needs refill on the muscle relaxer and the Klonopin.  His medications continue to work well for his muscle strain/muscle tightness and the Klonopin works well for his anxiety symptoms. Reports that he has been out of his testosterone recently so has not been able to do an injection "in quite some time ".  Is needing refill on that. No other specific concerns to address today.      Past Medical History:  Diagnosis Date  . Anxiety   . Depression   . Sleep apnea   . Thyroid disease      Home Meds: Outpatient Medications Prior to Visit  Medication Sig Dispense  Refill  . amphetamine-dextroamphetamine (ADDERALL) 20 MG tablet Take 1 tablet (20 mg total) by mouth 3 (three) times daily. 90 tablet 0  . Cholecalciferol (VITAMIN D3) 5000 units TABS Take 1 tablet (5,000 Units total) by mouth daily. 30 tablet 5  . clonazePAM (KLONOPIN) 0.5 MG tablet Take 1 tablet (0.5 mg total) by mouth 3 (three) times daily as needed for anxiety. 90 tablet 2  . metroNIDAZOLE (METROGEL) 1 % gel Apply topically daily. 45 g 0  . phentermine 37.5 MG capsule Take 1 capsule (37.5 mg total) by mouth every morning. 30 capsule 0  . testosterone cypionate (DEPO-TESTOSTERONE) 200 MG/ML injection Inject 1 mL (200 mg total) into the muscle every 14 (fourteen) days. Inject 1 ml intramuscular  every two weeks. 10 mL 5  . thyroid (ARMOUR THYROID) 120 MG tablet Take 1 tablet (120 mg total) by mouth daily before breakfast. 30 tablet 3  . tiZANidine (ZANAFLEX) 4 MG tablet Take 1 tablet (4 mg total) by mouth every 8 (eight) hours as needed for muscle spasms. 60 tablet 2   No facility-administered medications prior to visit.      Allergies: No Known Allergies  Social History   Socioeconomic History  . Marital status: Legally Separated    Spouse name: Not on file  . Number of children: Not on file  . Years of education: Not on file  . Highest education level: Not on file  Social Needs  . Financial resource strain: Not on file  . Food insecurity - worry: Not on file  . Food insecurity - inability: Not on file  . Transportation needs - medical: Not on file  . Transportation needs - non-medical: Not on file  Occupational History  . Not on file  Tobacco Use  . Smoking status: Never Smoker  . Smokeless tobacco: Never Used  Substance and Sexual Activity  . Alcohol use: No  . Drug use: No  . Sexual activity: Yes  Other Topics Concern  . Not on file  Social History Narrative  .  Not on file    Family History  Problem Relation Age of Onset  . Arthritis Mother   . Varicose Veins  Mother   . Alcohol abuse Father      Review of Systems:  See HPI for pertinent ROS. All other ROS negative.    Physical Exam: Blood pressure 124/80, pulse 84, temperature 98.2 F (36.8 C), temperature source Oral, resp. rate 16, height 5\' 10"  (1.778 m), weight 115.3 kg (254 lb 3.2 oz), SpO2 97 %., There is no height or weight on file to calculate BMI. General: WM. Appears in no acute distress. Neck: Supple. No thyromegaly. No lymphadenopathy. No carotid bruits. Lungs: Clear bilaterally to auscultation without wheezes, rales, or rhonchi. Breathing is unlabored. Heart: RRR with S1 S2. No murmurs, rubs, or gallops. Musculoskeletal:  Strength and tone normal for age. Neuro: Alert and oriented X 3. Moves all extremities spontaneously. Gait is normal. CNII-XII grossly in tact. Psych:  Responds to questions appropriately with a normal affect.     ASSESSMENT AND PLAN:  56 y.o. year old male with    1. Attention deficit disorder (ADD) without hyperactivity 01/11/2017: Stable/controlled.  Continue current dosing of Adderall.  Today I have printed 3 prescriptions.  One can be filled now.  1 to fill 02/11/2017 and 1 to fill 03/14/2017. - amphetamine-dextroamphetamine (ADDERALL) 20 MG tablet; Take 1 tablet (20 mg total) by mouth 3 (three) times daily.  Dispense: 90 tablet; Refill: 0  2. Generalized anxiety disorder 01/11/2017: Stable/controlled.  Continue current dosing of Klonopin - clonazePAM (KLONOPIN) 0.5 MG tablet; Take 1 tablet (0.5 mg total) by mouth 3 (three) times daily as needed for anxiety.  Dispense: 90 tablet; Refill: 2  3. Hypothyroidism, unspecified type Will recheck TSH to monitor. - TSH   4. Testosterone deficiency There is no need to recheck lab/testosterone level today because he has been off of medication.  I had recently checked lab when he was on no testosterone treatment.  We will wait to recheck a testosterone level to monitor while he is actually on therapy.   Also as  far as other labs to monitor when people are on testosterone therapy---he did have CBC, CME T, lipid panel, PSA 04/06/16.   Will wait to recheck these especially since he is not been on the testosterone therapy recently. - testosterone cypionate (DEPO-TESTOSTERONE) 200 MG/ML injection; Inject 1 mL (200 mg total) into the muscle every 14 (fourteen) days. Inject 1 ml intramuscular  every two weeks.  Dispense: 10 mL; Refill: 5    Other issues addressed at prior OVs:  Fasting hyperglycemia - Hemoglobin A1C, fingerstick A1C Was run while patient here in the office psych get those results to discuss with him. A1c is 5.6. Discussed with him that this is normal.  Vitamin D deficiency Lab 04/06/16 vitamin D level was low. He subsequently took the prescription strength 50,000 units weekly for 12 weeks. At this time I will have him start taking over-the-counter vitamin D at 5,000 units daily and will recheck this level at next lab draw at next routine visit.  Neck pain I have refilled that type tizanidine to use as needed.    Note that he had did have complete physical exam performed in [place] recently so we'll need to wait full year prior to repeating CPE/preventive care.   [address]igned, Mary Beth RosemontDixon, [place], [facility] 01/11/2017 12:43 PM

## 2017-01-19 ENCOUNTER — Telehealth: Payer: Self-pay

## 2017-01-19 DIAGNOSIS — F988 Other specified behavioral and emotional disorders with onset usually occurring in childhood and adolescence: Secondary | ICD-10-CM

## 2017-01-19 NOTE — Telephone Encounter (Signed)
Prior authorization started on amphetamine-dextroamphetamine 20 mg

## 2017-01-22 NOTE — Telephone Encounter (Signed)
Amphetamine-dextroamphetamine 20 mg tabs approved 11/21-2018-01/19/2018 pharmacy notified

## 2017-02-20 ENCOUNTER — Other Ambulatory Visit: Payer: Self-pay | Admitting: Physician Assistant

## 2017-02-20 DIAGNOSIS — E038 Other specified hypothyroidism: Secondary | ICD-10-CM

## 2017-02-20 NOTE — Telephone Encounter (Signed)
Refill appropriate 

## 2017-04-16 ENCOUNTER — Encounter: Payer: Self-pay | Admitting: Internal Medicine

## 2017-05-01 ENCOUNTER — Ambulatory Visit: Payer: Federal, State, Local not specified - PPO | Admitting: General Surgery

## 2017-05-01 ENCOUNTER — Encounter (INDEPENDENT_AMBULATORY_CARE_PROVIDER_SITE_OTHER): Payer: Self-pay

## 2017-05-01 ENCOUNTER — Encounter: Payer: Self-pay | Admitting: General Surgery

## 2017-05-01 VITALS — BP 137/82 | HR 97 | Temp 98.4°F | Ht 71.0 in | Wt 270.0 lb

## 2017-05-01 DIAGNOSIS — K432 Incisional hernia without obstruction or gangrene: Secondary | ICD-10-CM

## 2017-05-01 NOTE — Patient Instructions (Signed)
 Ventral Hernia A ventral hernia is a bulge of tissue from inside the abdomen that pushes through a weak area of the muscles that form the front wall of the abdomen. The tissues inside the abdomen are inside a sac (peritoneum). These tissues include the small intestine, large intestine, and the fatty tissue that covers the intestines (omentum). Sometimes, the bulge that forms a hernia contains intestines. Other hernias contain only fat. Ventral hernias do not go away without surgical treatment. There are several types of ventral hernias. You may have:  A hernia at an incision site from previous abdominal surgery (incisional hernia).  A hernia just above the belly button (epigastric hernia), or at the belly button (umbilical hernia). These types of hernias can develop from heavy lifting or straining.  A hernia that comes and goes (reducible hernia). It may be visible only when you lift or strain. This type of hernia can be pushed back into the abdomen (reduced).  A hernia that traps abdominal tissue inside the hernia (incarcerated hernia). This type of hernia does not reduce.  A hernia that cuts off blood flow to the tissues inside the hernia (strangulated hernia). The tissues can start to die if this happens. This is a very painful bulge that cannot be reduced. A strangulated hernia is a medical emergency.  What are the causes? This condition is caused by abdominal tissue putting pressure on an area of weakness in the abdominal muscles. What increases the risk? The following factors may make you more likely to develop this condition:  Being male.  Being 60 or older.  Being overweight or obese.  Having had previous abdominal surgery, especially if there was an infection after surgery.  Having had an injury to the abdominal wall.  Having had several pregnancies.  Having a buildup of fluid inside the abdomen (ascites).  What are the signs or symptoms? The only symptom of a ventral  hernia may be a painless bulge in the abdomen. A reducible hernia may be visible only when you strain, cough, or lift. Other symptoms may include:  Dull pain.  A feeling of pressure.  Signs and symptoms of a strangulated hernia may include:  Increasing pain.  Nausea and vomiting.  Pain when pressing on the hernia.  The skin over the hernia turning red or purple.  Constipation.  Blood in the stool (feces).  How is this diagnosed? This condition may be diagnosed based on:  Your symptoms.  Your medical history.  A physical exam. You may be asked to cough or strain while standing. These actions increase the pressure inside your abdomen and force the hernia through the opening in your muscles. Your health care provider may try to reduce the hernia by pressing on it.  Imaging studies, such as an ultrasound or CT scan.  How is this treated? This condition is treated with surgery. If you have a strangulated hernia, surgery is done as soon as possible. If your hernia is small and not incarcerated, you may be asked to lose some weight before surgery. Follow these instructions at home:  Follow instructions from your health care provider about eating or drinking restrictions.  If you are overweight, your health care provider may recommend that you increase your activity level and eat a healthier diet.  Do not lift anything that is heavier than 10 lb (4.5 kg).  Return to your normal activities as told by your health care provider. Ask your health care provider what activities are safe for you. You   may need to avoid activities that increase pressure on your hernia.  Take over-the-counter and prescription medicines only as told by your health care provider.  Keep all follow-up visits as told by your health care provider. This is important. Contact a health care provider if:  Your hernia gets larger.  Your hernia becomes painful. Get help right away if:  Your hernia becomes  increasingly painful.  You have pain along with any of the following: ? Changes in skin color in the area of the hernia. ? Nausea. ? Vomiting. ? Fever. Summary  A ventral hernia is a bulge of tissue from inside the abdomen that pushes through a weak area of the muscles that form the front wall of the abdomen.  This condition is treated with surgery, which may be urgent depending on your hernia.  Do not lift anything that is heavier than 10 lb (4.5 kg), and follow activity instructions from your health care provider. This information is not intended to replace advice given to you by your health care provider. Make sure you discuss any questions you have with your health care provider. Document Released: 01/03/2012 Document Revised: 09/03/2015 Document Reviewed: 09/03/2015 Elsevier Interactive Patient Education  2018 [employer]vier Inc[employer]  

## 2017-05-01 NOTE — H&P (Signed)
Luis Randall; [id number]; 03-15-1960   HPI Patient is a 57 year old white male who was referred to my care by Dr. Margo Aye for evaluation and treatment of an umbilical hernia.  Patient states he is having the hernia for some time now, but is increasing in size and is causing him discomfort.  He is status post a laparoscopic appendectomy in the remote past.  It hurts him when it is sticking out.  He denies any nausea or vomiting.  He currently has 0 out of 10 pain. Past Medical History:  Diagnosis Date  . Anxiety   . Depression   . Sleep apnea   . Thyroid disease     Past Surgical History:  Procedure Laterality Date  . APPENDECTOMY  2006    Family History  Problem Relation Age of Onset  . Arthritis Mother   . Varicose Veins Mother   . Alcohol abuse Father     Current Outpatient Medications on File Prior to Visit  Medication Sig Dispense Refill  . amphetamine-dextroamphetamine (ADDERALL) 20 MG tablet Take 1 tablet (20 mg total) by mouth 3 (three) times daily. 90 tablet 0  . ARMOUR THYROID 120 MG tablet TAKE (1) TABLET BY MOUTH ONCE DAILY. 30 tablet 0  . Cholecalciferol (VITAMIN D3) 5000 units TABS Take 1 tablet (5,000 Units total) by mouth daily. 30 tablet 5  . clonazePAM (KLONOPIN) 0.5 MG tablet Take 1 tablet (0.5 mg total) by mouth 3 (three) times daily as needed for anxiety. 90 tablet 2  . metroNIDAZOLE (METROGEL) 1 % gel Apply topically daily. 45 g 0  . testosterone cypionate (DEPO-TESTOSTERONE) 200 MG/ML injection Inject 1 mL (200 mg total) into the muscle every 14 (fourteen) days. Inject 1 ml intramuscular  every two weeks. 10 mL 5  . tiZANidine (ZANAFLEX) 4 MG tablet Take 1 tablet (4 mg total) by mouth every 8 (eight) hours as needed for muscle spasms. 60 tablet 2   No current facility-administered medications on file prior to visit.     No Known Allergies  Social History   Substance and Sexual Activity  Alcohol Use No    Social History   Tobacco Use  Smoking Status  Never Smoker  Smokeless Tobacco Never Used    Review of Systems  Constitutional: Positive for malaise/fatigue.  HENT: Negative.   Eyes: Negative.   Respiratory: Negative.   Cardiovascular: Negative.   Gastrointestinal: Negative.   Genitourinary: Negative.   Musculoskeletal: Positive for joint pain and neck pain.  Skin: Negative.   Neurological: Negative.   Endo/Heme/Allergies: Negative.   Psychiatric/Behavioral: Negative.     Objective   Vitals:   05/01/17 0914  BP: 137/82  Pulse: 97  Temp: 98.4 F (36.9 C)    Physical Exam  Constitutional: He is oriented to person, place, and time and well-developed, well-nourished, and in no distress.  HENT:  Head: Normocephalic and atraumatic.  Cardiovascular: Normal rate, regular rhythm and normal heart sounds. Exam reveals no gallop and no friction rub.  No murmur heard. Pulmonary/Chest: Effort normal and breath sounds normal. No respiratory distress. He has no wheezes. He has no rales.  Abdominal: Soft. Bowel sounds are normal. He exhibits no distension. There is no tenderness. There is no rebound.  Reducible hernia beneath the surgical scar at the umbilicus.  Neurological: He is alert and oriented to person, place, and time.  Skin: Skin is warm and dry.  Vitals reviewed. Dr. Scharlene Gloss notes reviewed.  Assessment  Incisional hernia Plan   Patient is scheduled for  incisional herniorrhaphy with mesh on 05/09/2017.  The risks and benefits of the procedure including bleeding, infection, mesh use, and the possibility of recurrence of the hernia were fully explained to the patient, who gave informed consent.

## 2017-05-01 NOTE — Progress Notes (Signed)
Luis Randall; [id number]; 03-15-1960   HPI Patient is a 57 year old white male who was referred to my care by Dr. Margo Aye for evaluation and treatment of an umbilical hernia.  Patient states he is having the hernia for some time now, but is increasing in size and is causing him discomfort.  He is status post a laparoscopic appendectomy in the remote past.  It hurts him when it is sticking out.  He denies any nausea or vomiting.  He currently has 0 out of 10 pain. Past Medical History:  Diagnosis Date  . Anxiety   . Depression   . Sleep apnea   . Thyroid disease     Past Surgical History:  Procedure Laterality Date  . APPENDECTOMY  2006    Family History  Problem Relation Age of Onset  . Arthritis Mother   . Varicose Veins Mother   . Alcohol abuse Father     Current Outpatient Medications on File Prior to Visit  Medication Sig Dispense Refill  . amphetamine-dextroamphetamine (ADDERALL) 20 MG tablet Take 1 tablet (20 mg total) by mouth 3 (three) times daily. 90 tablet 0  . ARMOUR THYROID 120 MG tablet TAKE (1) TABLET BY MOUTH ONCE DAILY. 30 tablet 0  . Cholecalciferol (VITAMIN D3) 5000 units TABS Take 1 tablet (5,000 Units total) by mouth daily. 30 tablet 5  . clonazePAM (KLONOPIN) 0.5 MG tablet Take 1 tablet (0.5 mg total) by mouth 3 (three) times daily as needed for anxiety. 90 tablet 2  . metroNIDAZOLE (METROGEL) 1 % gel Apply topically daily. 45 g 0  . testosterone cypionate (DEPO-TESTOSTERONE) 200 MG/ML injection Inject 1 mL (200 mg total) into the muscle every 14 (fourteen) days. Inject 1 ml intramuscular  every two weeks. 10 mL 5  . tiZANidine (ZANAFLEX) 4 MG tablet Take 1 tablet (4 mg total) by mouth every 8 (eight) hours as needed for muscle spasms. 60 tablet 2   No current facility-administered medications on file prior to visit.     No Known Allergies  Social History   Substance and Sexual Activity  Alcohol Use No    Social History   Tobacco Use  Smoking Status  Never Smoker  Smokeless Tobacco Never Used    Review of Systems  Constitutional: Positive for malaise/fatigue.  HENT: Negative.   Eyes: Negative.   Respiratory: Negative.   Cardiovascular: Negative.   Gastrointestinal: Negative.   Genitourinary: Negative.   Musculoskeletal: Positive for joint pain and neck pain.  Skin: Negative.   Neurological: Negative.   Endo/Heme/Allergies: Negative.   Psychiatric/Behavioral: Negative.     Objective   Vitals:   05/01/17 0914  BP: 137/82  Pulse: 97  Temp: 98.4 F (36.9 C)    Physical Exam  Constitutional: He is oriented to person, place, and time and well-developed, well-nourished, and in no distress.  HENT:  Head: Normocephalic and atraumatic.  Cardiovascular: Normal rate, regular rhythm and normal heart sounds. Exam reveals no gallop and no friction rub.  No murmur heard. Pulmonary/Chest: Effort normal and breath sounds normal. No respiratory distress. He has no wheezes. He has no rales.  Abdominal: Soft. Bowel sounds are normal. He exhibits no distension. There is no tenderness. There is no rebound.  Reducible hernia beneath the surgical scar at the umbilicus.  Neurological: He is alert and oriented to person, place, and time.  Skin: Skin is warm and dry.  Vitals reviewed. Dr. Scharlene Gloss notes reviewed.  Assessment  Incisional hernia Plan   Patient is scheduled for  incisional herniorrhaphy with mesh on 05/09/2017.  The risks and benefits of the procedure including bleeding, infection, mesh use, and the possibility of recurrence of the hernia were fully explained to the patient, who gave informed consent.

## 2017-05-07 ENCOUNTER — Other Ambulatory Visit (HOSPITAL_COMMUNITY): Payer: Self-pay

## 2017-05-09 ENCOUNTER — Encounter (HOSPITAL_COMMUNITY): Admission: RE | Payer: Self-pay | Source: Ambulatory Visit

## 2017-05-09 ENCOUNTER — Ambulatory Visit (HOSPITAL_COMMUNITY)
Admission: RE | Admit: 2017-05-09 | Payer: Federal, State, Local not specified - PPO | Source: Ambulatory Visit | Admitting: General Surgery

## 2017-05-09 SURGERY — REPAIR, HERNIA, INCISIONAL
Anesthesia: General

## 2017-05-30 ENCOUNTER — Encounter: Payer: Self-pay | Admitting: Orthopedic Surgery

## 2017-05-30 ENCOUNTER — Ambulatory Visit: Payer: Federal, State, Local not specified - PPO | Admitting: Orthopedic Surgery

## 2017-05-30 ENCOUNTER — Ambulatory Visit (INDEPENDENT_AMBULATORY_CARE_PROVIDER_SITE_OTHER): Payer: Federal, State, Local not specified - PPO

## 2017-05-30 VITALS — BP 129/85 | HR 78 | Ht 70.0 in | Wt 266.0 lb

## 2017-05-30 DIAGNOSIS — M1712 Unilateral primary osteoarthritis, left knee: Secondary | ICD-10-CM

## 2017-05-30 DIAGNOSIS — G8929 Other chronic pain: Secondary | ICD-10-CM | POA: Diagnosis not present

## 2017-05-30 DIAGNOSIS — M25562 Pain in left knee: Secondary | ICD-10-CM

## 2017-05-30 MED ORDER — DICLOFENAC SODIUM 75 MG PO TBEC: 75.0000 mg | DELAYED_RELEASE_TABLET | Freq: Two times a day (BID) | ORAL | 2 refills | Status: DC

## 2017-05-30 NOTE — Progress Notes (Signed)
NEW PATIENT OFFICE VISIT   Chief Complaint  Patient presents with  . Knee Pain    left knee pain for about 3 months      MEDICAL DECISION SECTION  xrays ordered? y  My independent reading of xrays: See dictated report the patient has patellofemoral arthritis with no tibiofemoral arthritis   Encounter Diagnoses  Name Primary?  . Chronic pain of left knee Yes  . Primary osteoarthritis of left knee      PLAN: Aspiration injection left knee Start diclofenac 75 twice daily Recommend weight loss Return in 6 months  Meds ordered this encounter  Medications  . diclofenac (VOLTAREN) 75 MG EC tablet    Sig: Take 1 tablet (75 mg total) by mouth 2 (two) times daily with a meal.    Dispense:  60 tablet    Refill:  2   Injection? y Procedure note injection and aspiration left knee joint  Verbal consent was obtained to aspirate and inject the left knee joint   Timeout was completed to confirm the site of aspiration and injection  An 18-gauge needle was used to aspirate the left knee joint from a suprapatellar lateral approach.  The medications used were 40 mg of Depo-Medrol and 1% lidocaine 3 cc  Anesthesia was provided by ethyl chloride and the skin was prepped with alcohol.  After cleaning the skin with alcohol an 18-gauge needle was used to aspirate the right knee joint.  We obtained 24 cc of fluid, clear yellow   We followed this by injection of 40 mg of Depo-Medrol and 3 cc 1% lidocaine.  There were no complications. A sterile bandage was applied.   Chief Complaint  Patient presents with  . 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  He complains of dull anterior knee pain on the left with pain associated with stairclimbing  unrelieved by Aleve   Review of Systems  All other systems reviewed and are negative.    Past Medical History:  Diagnosis Date  . Anxiety   . Depression   . Sleep apnea   . Thyroid disease     Past Surgical History:  Procedure Laterality Date  . APPENDECTOMY  2006  . ELBOW SURGERY Right 2010   release  . ENDOSCOPIC VEIN LASER TREATMENT Bilateral 2005  . SHOULDER SURGERY Right 2011  . VARICOSE VEIN SURGERY Bilateral 1995    Family History  Problem Relation Age of Onset  . Arthritis Mother   . Varicose Veins Mother   . Alcohol abuse Father   . Tuberculosis Father    Social History   Tobacco Use  . Smoking status: Never Smoker  . Smokeless tobacco: Never Used  Substance Use Topics  . Alcohol use: No  . Drug use: No    Current Meds  Medication Sig  . ARMOUR THYROID 120 MG tablet TAKE (1) TABLET BY MOUTH ONCE DAILY.  . clonazePAM (KLONOPIN) 0.5 MG tablet Take 1 tablet (0.5 mg total) by mouth 3 (three) times daily as needed for anxiety.  Marland Kitchen tiZANidine (ZANAFLEX) 4 MG tablet Take 1 tablet (4 mg total) by mouth every 8 (eight) hours as needed for muscle spasms.    BP  129/85   Pulse 78   Ht  (1.778 m)   Wt 266 lb (120.7 kg)   BMI 38.17 kg/m   Physical Exam  Constitutional: He is oriented to person, place, and time. He appears well-developed and well-nourished.  Vital signs have been reviewed and are stable. Gen. appearance the patient is well-developed and well-nourished with normal grooming and hygiene.   Musculoskeletal:       Left knee: He exhibits effusion.  Neurological: He is alert and oriented to person, place, and time.  Skin: Skin is warm and dry. No erythema.  Psychiatric: He has a normal mood and affect.  Vitals reviewed.   Right Knee Exam   Muscle Strength  The patient has normal right knee strength.  Tenderness  The patient is experiencing no tenderness.   Range of Motion  Extension: normal  Flexion: normal   Tests  McMurray:   Medial - negative Lateral - negative Varus: negative Valgus: negative Drawer:  Anterior - negative    Posterior - negative  Other  Erythema: absent Scars: absent Sensation: normal Pulse: present Swelling: none   Left Knee Exam   Muscle Strength  The patient has normal left knee strength.  Tenderness  Left knee tenderness location: Patellofemoral crepitance and pain.  Range of Motion  Extension:  5 normal  Flexion: normal Left knee flexion: 125.   Tests  McMurray:  Medial - negative Lateral - negative Varus: negative Valgus: negative Drawer:  Anterior - negative     Posterior - negative  Other  Erythema: absent Scars: absent Sensation: normal Pulse: present Swelling: none Effusion: effusion present

## 2017-06-20 ENCOUNTER — Encounter: Payer: Self-pay | Admitting: *Deleted

## 2017-06-20 ENCOUNTER — Encounter: Payer: Self-pay | Admitting: Nurse Practitioner

## 2017-06-20 ENCOUNTER — Telehealth: Payer: Self-pay | Admitting: General Practice

## 2017-06-20 ENCOUNTER — Other Ambulatory Visit: Payer: Self-pay | Admitting: *Deleted

## 2017-06-20 ENCOUNTER — Ambulatory Visit: Payer: Federal, State, Local not specified - PPO | Admitting: Nurse Practitioner

## 2017-06-20 DIAGNOSIS — Z1211 Encounter for screening for malignant neoplasm of colon: Secondary | ICD-10-CM

## 2017-06-20 DIAGNOSIS — R69 Illness, unspecified: Secondary | ICD-10-CM

## 2017-06-20 MED ORDER — CLENPIQ 10-3.5-12 MG-GM -GM/160ML PO SOLN: 1.0000 | Freq: Once | ORAL | 0 refills | Status: AC

## 2017-06-20 MED ORDER — PEG 3350-KCL-NA BICARB-NACL 420 G PO SOLR: 4000.0000 mL | Freq: Once | ORAL | 0 refills | Status: AC

## 2017-06-20 NOTE — Progress Notes (Signed)
Primary Care Physician:  Benita Stabile, MD Primary Gastroenterologist:  Dr. Jena Gauss  Chief Complaint  Patient presents with  . Consult    TCS    HPI:   Luis Randall is a 57 y.o. male who presents on referral from primary care for screening colonoscopy.  Reviewed pertinent information provided with the referral including office visit dated 04/12/2017.  At that time he presented for routine physical.  It was indicated at that time he was due for a colonoscopy.  Previous office visit dated 03/29/2017 indicates he has never had a colonoscopy.  He is currently age 45.  Reviewed labs included with his documentation which were drawn on 04/04/2017.  Hemoglobin high at 16.1, platelets normal, creatinine mildly elevated at 1.10, mild elevation of ALT at 37 with other LFTs normal.  Today he states he's doing well overall. Has never had a colonoscopy before. Denies abdominal pain, N/V, hematocheiza, melena, fever, chills, unintentional weight loss, acute changes in bowel habits. Denies chest pain, dyspnea, dizziness, lightheadedness, syncope, near syncope. Denies any other upper or lower GI symptoms.  Past Medical History:  Diagnosis Date  . Anxiety   . Depression   . Sleep apnea   . Thyroid disease     Past Surgical History:  Procedure Laterality Date  . APPENDECTOMY  2006  . ELBOW SURGERY Right 2010   release  . ENDOSCOPIC VEIN LASER TREATMENT Bilateral 2005  . SHOULDER SURGERY Right 2011  . VARICOSE VEIN SURGERY Bilateral 1995    Current Outpatient Medications  Medication Sig Dispense Refill  . ARMOUR THYROID 120 MG tablet TAKE (1) TABLET BY MOUTH ONCE DAILY. 30 tablet 0  . clonazePAM (KLONOPIN) 0.5 MG tablet Take 1 tablet (0.5 mg total) by mouth 3 (three) times daily as needed for anxiety. 90 tablet 2  . tiZANidine (ZANAFLEX) 4 MG tablet Take 1 tablet (4 mg total) by mouth every 8 (eight) hours as needed for muscle spasms. 60 tablet 2   No current facility-administered medications  for this visit.     Allergies as of 06/20/2017  . (No Known Allergies)    Family History  Problem Relation Age of Onset  . Arthritis Mother   . Varicose Veins Mother   . Alcohol abuse Father   . Tuberculosis Father   . Colon cancer Paternal Grandfather        Maybe...versus pancreatic cancer    Social History   Socioeconomic History  . Marital status: Legally Separated    Spouse name: Not on file  . Number of children: Not on file  . Years of education: Not on file  . Highest education level: Not on file  Occupational History  . Not on file  Social Needs  . Financial resource strain: Not on file  . Food insecurity:    Worry: Not on file    Inability: Not on file  . Transportation needs:    Medical: Not on file    Non-medical: Not on file  Tobacco Use  . Smoking status: Never Smoker  . Smokeless tobacco: Never Used  Substance and Sexual Activity  . Alcohol use: No  . Drug use: No  . Sexual activity: Yes  Lifestyle  . Physical activity:    Days per week: Not on file    Minutes per session: Not on file  . Stress: Not on file  Relationships  . Social connections:    Talks on phone: Not on file    Gets together: Not on  file    Attends religious service: Not on file    Active member of club or organization: Not on file    Attends meetings of clubs or organizations: Not on file    Relationship status: Not on file  . Intimate partner violence:    Fear of current or ex partner: Not on file    Emotionally abused: Not on file    Physically abused: Not on file    Forced sexual activity: Not on file  Other Topics Concern  . Not on file  Social History Narrative  . Not on file    Review of Systems: General: Negative for anorexia, weight loss, fever, chills, fatigue, weakness. ENT: Negative for hoarseness, difficulty swallowing. CV: Negative for chest pain, angina, palpitations, peripheral edema.  Respiratory: Negative for dyspnea at rest, cough, sputum,  wheezing.  GI: See history of present illness. MS: Negative for joint pain, low back pain.  Derm: Negative for rash or itching.  Endo: Negative for unusual weight change.  Heme: Negative for bruising or bleeding. Allergy: Negative for rash or hives.    Physical Exam: BP 134/83   Pulse 72   Temp (!) 97.4 F (36.3 C) (Oral)   Ht  (1.803 m)   Wt 264 lb 12.8 oz (120.1 kg)   BMI 36.93 kg/m  General:   Alert and oriented. Pleasant and cooperative. Well-nourished and well-developed.  Head:  Normocephalic and atraumatic. Eyes:  Without icterus, sclera clear and conjunctiva pink.  Ears:  Normal auditory acuity. Cardiovascular:  S1, S2 present without murmurs appreciated. Extremities without clubbing or edema. Respiratory:  Clear to auscultation bilaterally. No wheezes, rales, or rhonchi. No distress.  Gastrointestinal:  +BS, soft, non-tender and non-distended. No HSM noted. No guarding or rebound. No masses appreciated.  Rectal:  Deferred  Musculoskalatal:  Symmetrical without gross deformities. Normal posture. Neurologic:  Alert and oriented x4;  grossly normal neurologically. Psych:  Alert and cooperative. Normal mood and affect. Heme/Lymph/Immune: No excessive bruising noted.    06/20/2017 8:54 AM   Disclaimer: This note was dictated with voice recognition software. Similar sounding words can inadvertently be transcribed and may not be corrected upon review.

## 2017-06-20 NOTE — Assessment & Plan Note (Signed)
The patient is currently overdue for first ever screening colonoscopy.  Generally asymptomatic from a GI standpoint.  He was brought into the office to consider augmented sedation due to polypharmacy.  After reviewing his past medical history, review of systems, physical exam I do not see a contraindication to proceeding with colonoscopy at this time.  Given his chronic medications we will need to use augmented sedation.  We will proceed at this time.  Proceed with TCS on propofol/MAC with Dr. Gala Romney in near future: the risks, benefits, and alternatives have been discussed with the patient in detail. The patient states understanding and desires to proceed.  The patient recently discontinued Adderall.  He is currently on Klonopin as needed.  History of anxiety and depression.  No other anticoagulants, anxiolytics, chronic pain medications, or antidepressants.  We will plan for the procedure on propofol/MAC to promote adequate sedation.

## 2017-06-20 NOTE — Patient Instructions (Signed)
1. We will schedule your procedure for you. 2. Further recommendations will be made after your colonoscopy. 3. Return for follow-up based on recommendations made after your colonoscopy. 4. You can also call us for any GI symptoms as needed. 5. Call us if you have any questions or concerns.  At [facility] we value your feedback. You may receive a survey about your visit today. Please share your experience as we strive to create trusting relationships with our patients to provide genuine, compassionate, quality care.  It was great to meet you both today!  I hope you have a great summer!!

## 2017-06-20 NOTE — Progress Notes (Signed)
CC'D TO PCP °

## 2017-06-20 NOTE — Telephone Encounter (Signed)
I received a call from the wife stating they would like to change her husband's prep to Clenpiq.  She spoke with the insurance company and they will cover it at 100%.  Routing to Mindy to change instructions and send in [facility]

## 2017-06-20 NOTE — Telephone Encounter (Signed)
I have sent in new rx as requested and mailed new instructions. Pre-op scheduled for 08/06/17 at 11:00am. Letter mailed. LMOVM.

## 2017-06-20 NOTE — Addendum Note (Signed)
Addended by: Tommie Sams on: 06/20/2017 11:21 AM   Modules accepted: Orders

## 2017-07-03 ENCOUNTER — Ambulatory Visit: Payer: Federal, State, Local not specified - PPO | Admitting: Cardiovascular Disease

## 2017-07-18 ENCOUNTER — Ambulatory Visit: Payer: Federal, State, Local not specified - PPO | Admitting: Cardiovascular Disease

## 2017-07-18 NOTE — Progress Notes (Signed)
Cardiology Office Note   Date:  07/19/2017   ID:  Izzak, Fries [birth year], MRN [id number]  PCP:  Benita Stabile, MD  Cardiologist:   Charlton Haws, MD   No chief complaint on file.     History of Present Illness: Luis Randall is a 57 y.o. male who presents for consultation regarding chest pain and abnormal ECG Referred by Dr Margo Aye Reviewed his office note from 06/27/17  Patient complains of SSCP 4-5 days. Thought it was indigestion as belching improved pain. But now pain radiated to jaw Has had more exertional dyspnea but gained weight  Also admits to being anxious Ran short on his Klonopin. ECG was unremarkable with just some LAD   Labs reviewed from 04/12/17 LDL 86 TSH 4.8   He moved from [place] less than 2 years ago and married an old high school friend Works in Brewing technologist Depression and has 26 yo son with a drug problem that he tried to help to no avail and has moved back To [place]. Gained about 40 lbs since December   Past Medical History:  Diagnosis Date  . Anxiety   . Depression   . Sleep apnea   . Thyroid disease     Past Surgical History:  Procedure Laterality Date  . APPENDECTOMY  2006  . ELBOW SURGERY Right 2010   release  . ENDOSCOPIC VEIN LASER TREATMENT Bilateral 2005  . SHOULDER SURGERY Right 2011  . VARICOSE VEIN SURGERY Bilateral 1995     Current Outpatient Medications  Medication Sig Dispense Refill  . ARMOUR THYROID 120 MG tablet TAKE (1) TABLET BY MOUTH ONCE DAILY. 30 tablet 0  . clonazePAM (KLONOPIN) 0.5 MG tablet Take 1 tablet (0.5 mg total) by mouth 3 (three) times daily as needed for anxiety. 90 tablet 2  . tiZANidine (ZANAFLEX) 4 MG tablet Take 1 tablet (4 mg total) by mouth every 8 (eight) hours as needed for muscle spasms. (Patient taking differently: Take 4 mg by mouth every 4 (four) hours as needed for muscle spasms. ) 60 tablet 2   No current facility-administered medications for this visit.     Allergies:   Patient has no known  allergies.    Social History:  The patient  reports that he has never smoked. He has never used smokeless tobacco. He reports that he does not drink alcohol or use drugs.   Family History:  The patient's family history includes Alcohol abuse in his father; Arthritis in his mother; Colon cancer in his paternal grandfather; Tuberculosis in his father; Varicose Veins in his mother.    ROS:  Please see the history of present illness.   Otherwise, review of systems are positive for none.   All other systems are reviewed and negative.    PHYSICAL EXAM: VS:  BP 126/82 (BP Location: Left Arm, Cuff Size: Large)   Pulse 90   Ht 5\' 11"  (1.803 m)   Wt 272 lb 9.6 oz (123.7 kg)   SpO2 97%   BMI 38.02 kg/m  , BMI Body mass index is 38.02 kg/m. Affect appropriate Obese white male  HEENT: normal Neck supple with no adenopathy JVP normal no bruits no thyromegaly Lungs clear with no wheezing and good diaphragmatic motion Heart:  S1/S2 no murmur, no rub, gallop or click PMI normal Abdomen: benighn, BS positve, no tenderness, no AAA no bruit.  No HSM or HJR Distal pulses intact with no bruits Marked LE varicosities with edema and previous vein  stripping  Neuro non-focal Skin warm and dry No muscular weakness    EKG:  SR rate 61 LAD normal ST segments    Recent Labs: 01/11/2017: TSH 1.33    Lipid Panel    Component Value Date/Time   CHOL 146 04/06/2016 1121   TRIG 107 04/06/2016 1121   HDL 36 (L) 04/06/2016 1121   CHOLHDL 4.1 04/06/2016 1121   VLDL 21 04/06/2016 1121   LDLCALC 89 04/06/2016 1121      Wt Readings from Last 3 Encounters:  07/19/17 272 lb 9.6 oz (123.7 kg)  06/20/17 264 lb 12.8 oz (120.1 kg)  05/30/17 266 lb (120.7 kg)      Other studies Reviewed: Additional studies/ records that were reviewed today include: Notes from primary ECG and labs .    ASSESSMENT AND PLAN:  1.  Chest Pain: atypical ECG normal f/u ETT  2. Thyroid continue replacement TSH normal    3. Anxiety: realted to #1 Klonopin f/u primary  4. HLD:  Continue statin LDL 81 LFTls normal  5. Obesity: discussed diet and exercise goals low carb  6. Varicosities:  Gave him Loraine LericheMark Featherston[facility]     Current medicines are reviewed at length with the patient today.  The patient does not have concerns regarding medicines.  The following changes have been made:  no change  Labs/ tests ordered today include: ETT No orders of the defined types were placed in this encounter.    Disposition:   FU with cardiology PRN      Signed, Charlton HawsPeter Eulan Heyward, MD  07/19/2017 9:52 AM    [facility] [address], [place], [place]  [address] Phone: [phone]; Fax: [phone]

## 2017-07-19 ENCOUNTER — Ambulatory Visit: Payer: Federal, State, Local not specified - PPO | Admitting: Cardiovascular Disease

## 2017-07-19 ENCOUNTER — Encounter: Payer: Self-pay | Admitting: Cardiovascular Disease

## 2017-07-19 DIAGNOSIS — R079 Chest pain, unspecified: Secondary | ICD-10-CM | POA: Diagnosis not present

## 2017-07-19 NOTE — Patient Instructions (Signed)
Medication Instructions:  Your physician recommends that you continue on your current medications as directed. Please refer to the Current Medication list given to you today.   Labwork: none  Testing/Procedures: Your physician has requested that you have en exercise stress myoview. For further information please visit [email or web address]. Please follow instruction sheet, as given.    Follow-Up: Your physician recommends that you schedule a follow-up appointment in: as needed    Any Other Special Instructions Will Be Listed Below (If Applicable).     If you need a refill on your cardiac medications before your next appointment, please call your pharmacy.   

## 2017-07-27 ENCOUNTER — Encounter (HOSPITAL_BASED_OUTPATIENT_CLINIC_OR_DEPARTMENT_OTHER)
Admission: RE | Admit: 2017-07-27 | Discharge: 2017-07-27 | Disposition: A | Discharge status: Home / Self Care | Payer: Federal, State, Local not specified - PPO | Source: Ambulatory Visit | Attending: Cardiovascular Disease | Admitting: Cardiovascular Disease

## 2017-07-27 ENCOUNTER — Encounter (HOSPITAL_COMMUNITY)
Admission: RE | Admit: 2017-07-27 | Discharge: 2017-07-27 | Disposition: A | Discharge status: Home / Self Care | Payer: Federal, State, Local not specified - PPO | Source: Ambulatory Visit | Attending: Cardiovascular Disease | Admitting: Cardiovascular Disease

## 2017-07-27 ENCOUNTER — Encounter (HOSPITAL_COMMUNITY): Payer: Self-pay

## 2017-07-27 DIAGNOSIS — R9439 Abnormal result of other cardiovascular function study: Secondary | ICD-10-CM | POA: Diagnosis not present

## 2017-07-27 DIAGNOSIS — R079 Chest pain, unspecified: Secondary | ICD-10-CM | POA: Diagnosis present

## 2017-07-27 LAB — NM MYOCAR MULTI W/SPECT W/WALL MOTION / EF
Estimated workload: 10.1 METS
Exercise duration (min): 8 min
Exercise duration (sec): 21 s
LV dias vol: 80 mL (ref 62–150)
LV sys vol: 35 mL
MPHR: 162 {beats}/min
Peak HR: 148 {beats}/min
Percent HR: 91 %
RATE: 0.37
RPE: 14
Rest HR: 71 {beats}/min
SDS: 2
SRS: 1
SSS: 3
TID: 0.96

## 2017-07-27 MED ORDER — SODIUM CHLORIDE 0.9% FLUSH
INTRAVENOUS | Status: AC
  Administered 2017-07-27: 10 mL via INTRAVENOUS
  Filled 2017-07-27: qty 10

## 2017-07-27 MED ORDER — REGADENOSON 0.4 MG/5ML IV SOLN
INTRAVENOUS | Status: AC
  Filled 2017-07-27: qty 5

## 2017-07-27 MED ORDER — SODIUM CHLORIDE 0.9% FLUSH
INTRAVENOUS | Status: AC
  Filled 2017-07-27: qty 160

## 2017-07-27 MED ORDER — TECHNETIUM TC 99M TETROFOSMIN IV KIT
30.0000 | PACK | Freq: Once | INTRAVENOUS | Status: AC | PRN
  Administered 2017-07-27: 33 via INTRAVENOUS

## 2017-07-27 MED ORDER — TECHNETIUM TC 99M TETROFOSMIN IV KIT
10.0000 | PACK | Freq: Once | INTRAVENOUS | Status: AC | PRN
  Administered 2017-07-27: 10.2 via INTRAVENOUS

## 2017-07-30 ENCOUNTER — Telehealth: Payer: Self-pay

## 2017-07-30 MED ORDER — ASPIRIN EC 81 MG PO TBEC: 81.0000 mg | DELAYED_RELEASE_TABLET | Freq: Every day | ORAL | 3 refills | Status: DC

## 2017-07-30 MED ORDER — METOPROLOL TARTRATE 25 MG PO TABS: 12.5000 mg | ORAL_TABLET | Freq: Two times a day (BID) | ORAL | 6 refills | Status: DC

## 2017-07-30 MED ORDER — NITROGLYCERIN 0.4 MG SL SUBL: 0.4000 mg | SUBLINGUAL_TABLET | SUBLINGUAL | 3 refills | Status: DC | PRN

## 2017-07-30 NOTE — Telephone Encounter (Signed)
Apt made for pt for Wed 7/3, [id number]   I spoke with patient, he will [facility], I gave him instructions on how to use N[facility], and will come for apt on 08/01/17 at 3 pm

## 2017-07-30 NOTE — Telephone Encounter (Signed)
-----   Message from Ellsworth LennoxBrittany M Strader, [place]-C sent at 07/30/2017  2:33 PM EDT ----- Regarding: RE: apt   ----- Message ----- From: Nori Riisarlton, Catherine A, RN Sent: 07/30/2017   1:34 PM To: Ellsworth LennoxBrittany M Strader, PA-C Subject: apt                                            Can we add this pt on to your schedule ? ----- Message ----- From: Wendall StadeNishan, Peter C, MD Sent: 07/27/2017   5:26 PM To: Nori Riisatherine A Carlton, RN, Fonnie BirkenheadLisa R McGhee, CMA  myovue is abnormal start lopressor 12.5 bid ASA and make sure he has sl nitro. Have him see [place]any and set up for cath next week

## 2017-07-31 NOTE — Patient Instructions (Signed)
Luis PillarJeffrey H Randall  07/31/2017     @PREFPERIOPPHARMACY @   Your procedure is scheduled on   08/09/2017   [facility] at  [facility]  [facility].M.  Call this number if you have problems the morning of surgery:  [phone]   Remember:  Do not eat or drink after midnight.  You may drink clear liquids until (follow the instructions given to you) .  Clear liquids allowed are:                    Water, Juice (non-citric and without pulp), Carbonated beverages, Clear Tea, Black Coffee only, Plain Jell-O only, Gatorade and Plain Popsicles only    Take these medicines the morning of surgery with A SIP OF WATER  Armour thyroid, klonopin, metorpolol, zanaflex.    Do not wear jewelry, make-up or nail polish.  Do not wear lotions, powders, or perfumes, or deodorant.  Do not shave 48 hours prior to surgery.  Men may shave face and neck.  Do not bring valuables to the hospital.  [facility] belongings or valuables.  Contacts, dentures or bridgework may not be worn into surgery.  Leave your suitcase in the car.  After surgery it may be brought to your room.  For patients admitted to the hospital, discharge time will be determined by your treatment team.  Patients discharged the day of surgery will not be allowed to drive home.   Name and phone number of your driver:   family Special instructions:  Follow the diet and prep instructions given to you by Dr Luvenia Starchourk's office.  Please read over the following fact sheets that you were given. Anesthesia Post-op Instructions and Care and Recovery After Surgery       Colonoscopy, Adult A colonoscopy is an exam to look at the large intestine. It is done to check for problems, such as:  Lumps (tumors).  Growths (polyps).  Swelling (inflammation).  Bleeding.  What happens before the procedure? Eating and drinking Follow instructions from your doctor about eating and drinking. These instructions may include:  A  few days before the procedure - follow a low-fiber diet. ? Avoid nuts. ? Avoid seeds. ? Avoid dried fruit. ? Avoid raw fruits. ? Avoid vegetables.  1-3 days before the procedure - follow a clear liquid diet. Avoid liquids that have red or purple dye. Drink only clear liquids, such as: ? Clear broth or bouillon. ? Black coffee or tea. ? Clear juice. ? Clear soft drinks or sports drinks. ? Gelatin dessert. ? Popsicles.  On the day of the procedure - do not eat or drink anything during the 2 hours before the procedure.  Bowel prep If you were prescribed an oral bowel prep:  Take it as told by your doctor. Starting the day before your procedure, you will need to drink a lot of liquid. The liquid will cause you to poop (have bowel movements) until your poop is almost clear or light green.  If your skin or butt gets irritated from diarrhea, you may: ? Wipe the area with wipes that have medicine in them, such as adult wet wipes with aloe and vitamin E. ? Put something on your skin that soothes the area, such as petroleum jelly.  If you throw up (vomit) while drinking the bowel prep, take a break for up to 60 minutes. Then begin the bowel prep again. If you keep throwing up  and you cannot take the bowel prep without throwing up, call your doctor.  General instructions  Ask your doctor about changing or stopping your normal medicines. This is important if you take diabetes medicines or blood thinners.  Plan to have someone take you home from the hospital or clinic. What happens during the procedure?  An IV tube may be put into one of your veins.  You will be given medicine to help you relax (sedative).  To reduce your risk of infection: ? Your doctors will wash their hands. ? Your anal area will be washed with soap.  You will be asked to lie on your side with your knees bent.  Your doctor will get a long, thin, flexible tube ready. The tube will have a camera and a light on the  end.  The tube will be put into your anus.  The tube will be gently put into your large intestine.  Air will be delivered into your large intestine to keep it open. You may feel some pressure or cramping.  The camera will be used to take photos.  A small tissue sample may be removed from your body to be looked at under a microscope (biopsy). If any possible problems are found, the tissue will be sent to a lab for testing.  If small growths are found, your doctor may remove them and have them checked for cancer.  The tube that was put into your anus will be slowly removed. The procedure may vary among doctors and hospitals. What happens after the procedure?  Your doctor will check on you often until the medicines you were given have worn off.  Do not drive for 24 hours after the procedure.  You may have a small amount of blood in your poop.  You may pass gas.  You may have mild cramps or bloating in your belly (abdomen).  It is up to you to get the results of your procedure. Ask your doctor, or the department performing the procedure, when your results will be ready. This information is not intended to replace advice given to you by your health care provider. Make sure you discuss any questions you have with your health care provider. Document Released: 02/18/2010 Document Revised: 11/17/2015 Document Reviewed: 03/30/2015 Elsevier Interactive Patient Education  2017 [employer]  Colonoscopy, Adult, Care After This sheet gives you information about how to care for yourself after your procedure. Your health care provider may also give you more specific instructions. If you have problems or questions, contact your health care provider. What can I expect after the procedure? After the procedure, it is common to have:  A small amount of blood in your stool for 24 hours after the procedure.  Some gas.  Mild abdominal cramping or bloating.  Follow these instructions at  home: General instructions   For the first 24 hours after the procedure: ? Do not drive or use machinery. ? Do not sign important documents. ? Do not drink alcohol. ? Do your regular daily activities at a slower pace than normal. ? Eat soft, easy-to-digest foods. ? Rest often.  Take over-the-counter or prescription medicines only as told by your health care provider.  It is up to you to get the results of your procedure. Ask your health care provider, or the department performing the procedure, when your results will be ready. Relieving cramping and bloating  Try walking around when you have cramps or feel bloated.  Apply heat to your abdomen as  told by your health care provider. Use a heat source that your health care provider recommends, such as a moist heat pack or a heating pad. ? Place a towel between your skin and the heat source. ? Leave the heat on for 20-30 minutes. ? Remove the heat if your skin turns bright red. This is especially important if you are unable to feel pain, heat, or cold. You may have a greater risk of getting burned. Eating and drinking  Drink enough fluid to keep your urine clear or pale yellow.  Resume your normal diet as instructed by your health care provider. Avoid heavy or fried foods that are hard to digest.  Avoid drinking alcohol for as long as instructed by your health care provider. Contact a health care provider if:  You have blood in your stool 2-3 days after the procedure. Get help right away if:  You have more than a small spotting of blood in your stool.  You pass large blood clots in your stool.  Your abdomen is swollen.  You have nausea or vomiting.  You have a fever.  You have increasing abdominal pain that is not relieved with medicine. This information is not intended to replace advice given to you by your health care provider. Make sure you discuss any questions you have with your health care provider. Document Released:  08/31/2003 Document Revised: 10/11/2015 Document Reviewed: 03/30/2015 Elsevier Interactive Patient Education  2018 [place] Anesthesia is a term that refers to techniques, procedures, and medicines that help a person stay safe and comfortable during a medical procedure. Monitored anesthesia care, or sedation, is one type of anesthesia. Your anesthesia specialist may recommend sedation if you will be having a procedure that does not require you to be unconscious, such as:  Cataract surgery.  A dental procedure.  A biopsy.  A colonoscopy.  During the procedure, you may receive a medicine to help you relax (sedative). There are three levels of sedation:  Mild sedation. At this level, you may feel awake and relaxed. You will be able to follow directions.  Moderate sedation. At this level, you will be sleepy. You may not remember the procedure.  Deep sedation. At this level, you will be asleep. You will not remember the procedure.  The more medicine you are given, the deeper your level of sedation will be. Depending on how you respond to the procedure, the anesthesia specialist may change your level of sedation or the type of anesthesia to fit your needs. An anesthesia specialist will monitor you closely during the procedure. Let your health care provider know about:  Any allergies you have.  All medicines you are taking, including vitamins, herbs, eye drops, creams, and over-the-counter medicines.  Any use of steroids (by mouth or as a cream).  Any problems you or family members have had with sedatives and anesthetic medicines.  Any blood disorders you have.  Any surgeries you have had.  Any medical conditions you have, such as sleep apnea.  Whether you are pregnant or may be pregnant.  Any use of cigarettes, alcohol, or street drugs. What are the risks? Generally, this is a safe procedure. However, problems may occur, including:  Getting too  much medicine (oversedation).  Nausea.  Allergic reaction to medicines.  Trouble breathing. If this happens, a breathing tube may be used to help with breathing. It will be removed when you are awake and breathing on your own.  Heart trouble.  Lung trouble.  Before the procedure Staying hydrated Follow instructions from your health care provider about hydration, which may include:  Up to 2 hours before the procedure - you may continue to drink clear liquids, such as water, clear fruit juice, black coffee, and plain tea.  Eating and drinking restrictions Follow instructions from your health care provider about eating and drinking, which may include:  8 hours before the procedure - stop eating heavy meals or foods such as meat, fried foods, or fatty foods.  6 hours before the procedure - stop eating light meals or foods, such as toast or cereal.  6 hours before the procedure - stop drinking milk or drinks that contain milk.  2 hours before the procedure - stop drinking clear liquids.  Medicines Ask your health care provider about:  Changing or stopping your regular medicines. This is especially important if you are taking diabetes medicines or blood thinners.  Taking medicines such as aspirin and ibuprofen. These medicines can thin your blood. Do not take these medicines before your procedure if your health care provider instructs you not to.  Tests and exams  You will have a physical exam.  You may have blood tests done to show: ? How well your kidneys and liver are working. ? How well your blood can clot.  General instructions  Plan to have someone take you home from the hospital or clinic.  If you will be going home right after the procedure, plan to have someone with you for 24 hours.  What happens during the procedure?  Your blood pressure, heart rate, breathing, level of pain and overall condition will be monitored.  An IV tube will be inserted into one of  your veins.  Your anesthesia specialist will give you medicines as needed to keep you comfortable during the procedure. This may mean changing the level of sedation.  The procedure will be performed. After the procedure  Your blood pressure, heart rate, breathing rate, and blood oxygen level will be monitored until the medicines you were given have worn off.  Do not drive for 24 hours if you received a sedative.  You may: ? Feel sleepy, clumsy, or nauseous. ? Feel forgetful about what happened after the procedure. ? Have a sore throat if you had a breathing tube during the procedure. ? Vomit. This information is not intended to replace advice given to you by your health care provider. Make sure you discuss any questions you have with your health care provider. Document Released: 10/12/2004 Document Revised: 06/25/2015 Document Reviewed: 05/09/2015 Elsevier Interactive Patient Education  2018 [place], Care After These instructions provide you with information about caring for yourself after your procedure. Your health care provider may also give you more specific instructions. Your treatment has been planned according to current medical practices, but problems sometimes occur. Call your health care provider if you have any problems or questions after your procedure. What can I expect after the procedure? After your procedure, it is common to:  Feel sleepy for several hours.  Feel clumsy and have poor balance for several hours.  Feel forgetful about what happened after the procedure.  Have poor judgment for several hours.  Feel nauseous or vomit.  Have a sore throat if you had a breathing tube during the procedure.  Follow these instructions at home: For at least 24 hours after the procedure:   Do not: ? Participate in activities in which you could fall or become injured. ? Drive. ?  Use heavy machinery. ? Drink alcohol. ? Take sleeping pills  or medicines that cause drowsiness. ? Make important decisions or sign legal documents. ? Take care of children on your own.  Rest. Eating and drinking  Follow the diet that is recommended by your health care provider.  If you vomit, drink water, juice, or soup when you can drink without vomiting.  Make sure you have little or no nausea before eating solid foods. General instructions  Have a responsible adult stay with you until you are awake and alert.  Take over-the-counter and prescription medicines only as told by your health care provider.  If you smoke, do not smoke without supervision.  Keep all follow-up visits as told by your health care provider. This is important. Contact a health care provider if:  You keep feeling nauseous or you keep vomiting.  You feel light-headed.  You develop a rash.  You have a fever. Get help right away if:  You have trouble breathing. This information is not intended to replace advice given to you by your health care provider. Make sure you discuss any questions you have with your health care provider. Document Released: 05/09/2015 Document Revised: 09/08/2015 Document Reviewed: 05/09/2015 Elsevier Interactive Patient Education  [employer].

## 2017-08-01 ENCOUNTER — Other Ambulatory Visit (HOSPITAL_COMMUNITY)
Admission: RE | Admit: 2017-08-01 | Discharge: 2017-08-01 | Disposition: A | Discharge status: Home / Self Care | Payer: Federal, State, Local not specified - PPO | Source: Ambulatory Visit | Attending: Student | Admitting: Student

## 2017-08-01 ENCOUNTER — Ambulatory Visit: Payer: Federal, State, Local not specified - PPO | Admitting: Student

## 2017-08-01 ENCOUNTER — Encounter: Payer: Self-pay | Admitting: Student

## 2017-08-01 ENCOUNTER — Telehealth: Payer: Self-pay | Admitting: Student

## 2017-08-01 VITALS — BP 110/76 | HR 96 | Ht 71.0 in | Wt 272.0 lb

## 2017-08-01 DIAGNOSIS — R079 Chest pain, unspecified: Secondary | ICD-10-CM

## 2017-08-01 DIAGNOSIS — R9439 Abnormal result of other cardiovascular function study: Secondary | ICD-10-CM

## 2017-08-01 DIAGNOSIS — R0609 Other forms of dyspnea: Secondary | ICD-10-CM | POA: Diagnosis not present

## 2017-08-01 DIAGNOSIS — R0600 Dyspnea, unspecified: Secondary | ICD-10-CM

## 2017-08-01 DIAGNOSIS — E039 Hypothyroidism, unspecified: Secondary | ICD-10-CM

## 2017-08-01 LAB — BASIC METABOLIC PANEL
Anion gap: 6 (ref 5–15)
BUN: 16 mg/dL (ref 6–20)
CO2: 25 mmol/L (ref 22–32)
Calcium: 9 mg/dL (ref 8.9–10.3)
Chloride: 107 mmol/L (ref 98–111)
Creatinine, Ser: 1.11 mg/dL (ref 0.61–1.24)
GFR calc Af Amer: >60 mL/min (ref >60)
GFR calc non Af Amer: >60 mL/min (ref >60)
Glucose, Bld: 103 mg/dL — ABNORMAL HIGH (ref 70–99)
Potassium: 4.1 mmol/L (ref 3.5–5.1)
Sodium: 138 mmol/L (ref 135–145)

## 2017-08-01 LAB — CBC
HCT: 52 % (ref 39.0–52.0)
Hemoglobin: 17.8 g/dL — ABNORMAL HIGH (ref 13.0–17.0)
MCH: 32.5 pg (ref 26.0–34.0)
MCHC: 34.2 g/dL (ref 30.0–36.0)
MCV: 94.9 fL (ref 78.0–100.0)
Platelets: 217 10*3/uL (ref 150–400)
RBC: 5.48 MIL/uL (ref 4.22–5.81)
RDW: 12.7 % (ref 11.5–15.5)
WBC: 8.1 10*3/uL (ref 4.0–10.5)

## 2017-08-01 NOTE — Telephone Encounter (Signed)
°  Left heart cath Monday, August 06, 2017 @12 :00 with Dr. Okey DupreEnd arrive at 10:00 am dx: abnormal stress test & chest pain

## 2017-08-01 NOTE — H&P (View-Only) (Signed)
 Cardiology Office Note    Date:  08/01/2017   ID:  Luis Randall, DOB 09/13/1960, MRN [id number]  PCP:  Hall, John Z, MD  Cardiologist: Peter Nishan, MD    Chief Complaint  Patient presents with  . Follow-up    s/p stress testing    History of Present Illness:    Luis Randall is a 56 y.o. male with past medical history of Hypothyroidism, HLD, and obesity who presents to the office today for follow-up from a recent abnormal stress test.  He was recently examined by Dr. Nishan on 07/19/2017 as a new patient referral for evaluation of chest discomfort. He reported having substernal chest discomfort 5 days prior to his visit which he thought was secondary to indigestion. Reported episodes of pain radiating to his jaw and worsening exertional dyspnea over the past several months.  A Treadmill Myoview was recommended for further evaluation. This was performed on 07/27/2017 and showed findings consistent with prior inferior myocardial infarction and mild to moderate peri-infarct ischemia. The study was overall intermediate-risk but given his recent symptoms, he was started on Lopressor 12.5 mg twice daily and Dr. Nishan recommended a cardiac catheterization for further evaluation. He presents today to review the procedure.  In talking with the patient and his wife today, he reports having worsening dyspnea on exertion over the past 6 months which he thought was secondary to weight gain. He has been having episodes of chest discomfort for over 3 weeks which he accredited to indigestion but did not have any improvement in his symptoms with the use of antacids. Over the past few weeks, this has radiated into his jaw at different times. Symptoms can occur at rest or with activity. Denies any acute worsening of his symptoms.   He denies any recent orthopnea, PND, lower extremity edema, or palpitations. He recently started Lopressor a few days ago and denies any noted side effects to this. BP is  overall well controlled at 110/76 during today's visit.   Past Medical History:  Diagnosis Date  . Anxiety   . Depression   . Sleep apnea   . Thyroid disease     Past Surgical History:  Procedure Laterality Date  . APPENDECTOMY  2006  . ELBOW SURGERY Right 2010   release  . ENDOSCOPIC VEIN LASER TREATMENT Bilateral 2005  . SHOULDER SURGERY Right 2011  . VARICOSE VEIN SURGERY Bilateral 1995    Current Medications: Outpatient Medications Prior to Visit  Medication Sig Dispense Refill  . ARMOUR THYROID 120 MG tablet TAKE (1) TABLET BY MOUTH ONCE DAILY. 30 tablet 0  . aspirin EC 81 MG tablet Take 1 tablet (81 mg total) by mouth daily. 90 tablet 3  . clonazePAM (KLONOPIN) 0.5 MG tablet Take 1 tablet (0.5 mg total) by mouth 3 (three) times daily as needed for anxiety. 90 tablet 2  . metoprolol tartrate (LOPRESSOR) 25 MG tablet Take 0.5 tablets (12.5 mg total) by mouth 2 (two) times daily. 45 tablet 6  . nitroGLYCERIN (NITROSTAT) 0.4 MG SL tablet Place 1 tablet (0.4 mg total) under the tongue every 5 (five) minutes as needed. 25 tablet 3  . tiZANidine (ZANAFLEX) 4 MG tablet Take 1 tablet (4 mg total) by mouth every 8 (eight) hours as needed for muscle spasms. (Patient taking differently: Take 4 mg by mouth every 4 (four) hours as needed for muscle spasms. ) 60 tablet 2   No facility-administered medications prior to visit.      Allergies:     Patient has no known allergies.   Social History   Socioeconomic History  . Marital status: Legally Separated    Spouse name: Not on file  . Number of children: Not on file  . Years of education: Not on file  . Highest education level: Not on file  Occupational History  . Not on file  Social Needs  . Financial resource strain: Not on file  . Food insecurity:    Worry: Not on file    Inability: Not on file  . Transportation needs:    Medical: Not on file    Non-medical: Not on file  Tobacco Use  . Smoking status: Never Smoker  .  Smokeless tobacco: Never Used  Substance and Sexual Activity  . Alcohol use: No  . Drug use: No  . Sexual activity: Yes  Lifestyle  . Physical activity:    Days per week: Not on file    Minutes per session: Not on file  . Stress: Not on file  Relationships  . Social connections:    Talks on phone: Not on file    Gets together: Not on file    Attends religious service: Not on file    Active member of club or organization: Not on file    Attends meetings of clubs or organizations: Not on file    Relationship status: Not on file  Other Topics Concern  . Not on file  Social History Narrative  . Not on file     Family History:  The patient's family history includes Alcohol abuse in his father; Arthritis in his mother; Colon cancer in his paternal grandfather; Tuberculosis in his father; Varicose Veins in his mother.   Review of Systems:   Please see the history of present illness.     General:  No chills, fever, night sweats or weight changes.  Cardiovascular:  No edema, orthopnea, palpitations, paroxysmal nocturnal dyspnea. Positive for chest pain and dyspnea on exertion.  Dermatological: No rash, lesions/masses Respiratory: No cough, dyspnea Urologic: No hematuria, dysuria Abdominal:   No nausea, vomiting, diarrhea, bright red blood per rectum, melena, or hematemesis Neurologic:  No visual changes, wkns, changes in mental status. All other systems reviewed and are otherwise negative except as noted above.   Physical Exam:    VS:  BP 110/76   Pulse 96   Ht 5' 11" (1.803 m)   Wt 272 lb (123.4 kg)   SpO2 97%   BMI 37.94 kg/m    General: Well developed, overweight Caucasian male appearing in no acute distress. Head: Normocephalic, atraumatic, sclera non-icteric, no xanthomas, nares are without discharge.  Neck: No carotid bruits. JVD not elevated.  Lungs: Respirations regular and unlabored, without wheezes or rales.  Heart: Regular rate and rhythm. No S3 or S4.  No  murmur, no rubs, or gallops appreciated. Abdomen: Soft, non-tender, non-distended with normoactive bowel sounds. No hepatomegaly. No rebound/guarding. No obvious abdominal masses. Msk:  Strength and tone appear normal for age. No joint deformities or effusions. Extremities: No clubbing or cyanosis. No lower extremity edema.  Distal pedal pulses are 2+ bilaterally. Neuro: Alert and oriented X 3. Moves all extremities spontaneously. No focal deficits noted. Psych:  Responds to questions appropriately with a normal affect. Skin: No rashes or lesions noted  Wt Readings from Last 3 Encounters:  08/01/17 272 lb (123.4 kg)  07/19/17 272 lb 9.6 oz (123.7 kg)  06/20/17 264 lb 12.8 oz (120.1 kg)     Studies/Labs Reviewed:   EKG:    EKG is not ordered today. EKG from recent NST on 07/27/2017 reviewed which shows NSR, HR 71, with no acute ST changes when compared to prior tracings.   Recent Labs: 01/11/2017: TSH 1.33 08/01/2017: BUN 16; Creatinine, Ser 1.11; Hemoglobin 17.8; Platelets 217; Potassium 4.1; Sodium 138   Lipid Panel    Component Value Date/Time   CHOL 146 04/06/2016 1121   TRIG 107 04/06/2016 1121   HDL 36 (L) 04/06/2016 1121   CHOLHDL 4.1 04/06/2016 1121   VLDL 21 04/06/2016 1121   LDLCALC 89 04/06/2016 1121    Additional studies/ records that were reviewed today include:   NST: 06/2017  Blood pressure demonstrated a normal response to exercise.  There was no ST segment deviation noted during stress.  Findings consistent with prior inferior myocardial infarction with mild to moderate peri-infarct ischemia.  The left ventricular ejection fraction is normal (55-65%).  Low to intermediate risk study based on imaging. Duke treadmill score of 8 would suggest low risk for major cardiac events.  Assessment:    1. Chest pain, unspecified type   2. Dyspnea on exertion   3. Abnormal stress test   4. Hypothyroidism, unspecified type      Plan:   In order of problems listed  above:  1. Chest Pain/ Dyspnea on Exertion/ Abnormal Stress Test - The patient was recently referred to Cardiology for evaluation of chest discomfort which he initially thought was secondary to indigestion but he has experienced episodes which radiate into his jaw and are not associated with food consumption. He has also experienced worsening dyspnea on exertion over the past 6+ months. A Treadmill Myoview was performed on 07/27/2017 and showed findings consistent with prior inferior myocardial infarction and mild to moderate peri-infarct ischemia. Dr. Nishan recommended a cardiac catheterization for definitive evaluation. The patient has no planned upcoming surgeries or procedures. Was initially scheduled for a screening colonoscopy next week but his wife already cancelled the procedure given the need for a catheterization.  - The procedure was reviewed with the patient and his wife in detail today and he is in agreement to proceed. The patient understands that risks include but are not limited to stroke (1 in 1000), death (1 in 1000), kidney failure [usually temporary] (1 in 500), bleeding (1 in 200), allergic reaction [possibly serious] (1 in 200). Will plan for cardiac catheterization early next week. He is aware that if symptoms worsen in the interim and do not resolve with SL NTG, then he should proceed to the ED for further evaluation.  - will check CBC and BMET today. Continue ASA and BB therapy. HLD is listed in his history but he is not currently on statin therapy. LDL was 86 by review of labs from 03/2017. If noted to have CAD on catheterization, would initiate statin therapy. He has an updated Rx for SL NTG.  2. Hypothyroidism - Followed by PCP. Remains on Armour Thyroid.     Medication Adjustments/Labs and Tests Ordered: Current medicines are reviewed at length with the patient today.  Concerns regarding medicines are outlined above.  Medication changes, Labs and Tests ordered today are  listed in the Patient Instructions below. Patient Instructions  Medication Instructions:   Your physician recommends that you continue on your current medications as directed. Please refer to the Current Medication list given to you today.  Labwork:  Your physician recommends that you return for lab work in: today to check your CBC & BMET.  Testing/Procedures: Your physician has requested that you   have a cardiac catheterization. Cardiac catheterization is used to diagnose and/or treat various heart conditions. Doctors may recommend this procedure for a number of different reasons. The most common reason is to evaluate chest pain. Chest pain can be a symptom of coronary artery disease (CAD), and cardiac catheterization can show whether plaque is narrowing or blocking your heart's arteries. This procedure is also used to evaluate the valves, as well as measure the blood flow and oxygen levels in different parts of your heart. For further information please visit www.cardiosmart.org. Please follow instruction sheet, as given.  Follow-Up:  Your physician recommends that you schedule a follow-up appointment in: 1 month.  Any Other Special Instructions Will Be Listed Below (If Applicable).   [facility] [address] [place] [address] Dept: [phone] Loc: [phone]  Fredrich H Cowans  08/01/2017  You are scheduled for a Cardiac Catheterization on Monday, July 8 with Dr. Christopher End.  1. Please arrive at the [facility] Tower (Main Entrance A) at [facility]: [address] [place], [place] [address] at 10:00 AM (two hours before your procedure to ensure your preparation). Free valet parking service is available.   Special note: Every effort is made to have your procedure done on time. Please understand that emergencies sometimes delay scheduled procedures.  2. Diet: No solid foods after midnight the prior  to your procedure. You may have clear liquids until 5:00 am on the day of your cath.  3. Labs: BMET & CBC today 08/01/17 at [facility].  4. Medication instructions in preparation for your procedure:  On the morning of your procedure, take your Aspirin 81 mg and any morning medicines NOT listed above.  You may use sips of water.  5. Plan for one night stay--bring personal belongings. 6. Bring a current list of your medications and current insurance cards. 7. You MUST have a responsible person to drive you home. 8. Someone MUST be with you the first 24 hours after you arrive home or your discharge will be delayed. 9. Please wear clothes that are easy to get on and off and wear slip-on shoes.  Thank you for allowing us to care for you!    -- [facility]  If you need a refill on your cardiac medications before your next appointment, please call your pharmacy.    Signed, Jakira Mcfadden M Seleste Tallman, PA-C  08/01/2017 4:36 PM    [facility] [address] [place], [place] [address] Phone: [phone]   

## 2017-08-01 NOTE — Progress Notes (Signed)
Cardiology Office Note    Date:  08/01/2017   ID:  Luis Randall, DOB 1961-01-05, MRN [id number]  PCP:  Benita Stabile, MD  Cardiologist: Charlton Haws, MD    Chief Complaint  Patient presents with  . Follow-up    s/p stress testing    History of Present Illness:    Luis Randall is a 57 y.o. male with past medical history of Hypothyroidism, HLD, and obesity who presents to the office today for follow-up from a recent abnormal stress test.  He was recently examined by Dr. Eden Emms on 07/19/2017 as a new patient referral for evaluation of chest discomfort. He reported having substernal chest discomfort 5 days prior to his visit which he thought was secondary to indigestion. Reported episodes of pain radiating to his jaw and worsening exertional dyspnea over the past several months.  A Treadmill Myoview was recommended for further evaluation. This was performed on 07/27/2017 and showed findings consistent with prior inferior myocardial infarction and mild to moderate peri-infarct ischemia. The study was overall intermediate-risk but given his recent symptoms, he was started on Lopressor 12.5 mg twice daily and Dr. Eden Emms recommended a cardiac catheterization for further evaluation. He presents today to review the procedure.  In talking with the patient and his wife today, he reports having worsening dyspnea on exertion over the past 6 months which he thought was secondary to weight gain. He has been having episodes of chest discomfort for over 3 weeks which he accredited to indigestion but did not have any improvement in his symptoms with the use of antacids. Over the past few weeks, this has radiated into his jaw at different times. Symptoms can occur at rest or with activity. Denies any acute worsening of his symptoms.   He denies any recent orthopnea, PND, lower extremity edema, or palpitations. He recently started Lopressor a few days ago and denies any noted side effects to this. BP is  overall well controlled at 110/76 during today's visit.   Past Medical History:  Diagnosis Date  . Anxiety   . Depression   . Sleep apnea   . Thyroid disease     Past Surgical History:  Procedure Laterality Date  . APPENDECTOMY  2006  . ELBOW SURGERY Right 2010   release  . ENDOSCOPIC VEIN LASER TREATMENT Bilateral 2005  . SHOULDER SURGERY Right 2011  . VARICOSE VEIN SURGERY Bilateral 1995    Current Medications: Outpatient Medications Prior to Visit  Medication Sig Dispense Refill  . ARMOUR THYROID 120 MG tablet TAKE (1) TABLET BY MOUTH ONCE DAILY. 30 tablet 0  . aspirin EC 81 MG tablet Take 1 tablet (81 mg total) by mouth daily. 90 tablet 3  . clonazePAM (KLONOPIN) 0.5 MG tablet Take 1 tablet (0.5 mg total) by mouth 3 (three) times daily as needed for anxiety. 90 tablet 2  . metoprolol tartrate (LOPRESSOR) 25 MG tablet Take 0.5 tablets (12.5 mg total) by mouth 2 (two) times daily. 45 tablet 6  . nitroGLYCERIN (NITROSTAT) 0.4 MG SL tablet Place 1 tablet (0.4 mg total) under the tongue every 5 (five) minutes as needed. 25 tablet 3  . tiZANidine (ZANAFLEX) 4 MG tablet Take 1 tablet (4 mg total) by mouth every 8 (eight) hours as needed for muscle spasms. (Patient taking differently: Take 4 mg by mouth every 4 (four) hours as needed for muscle spasms. ) 60 tablet 2   No facility-administered medications prior to visit.      Allergies:  Patient has no known allergies.   Social History   Socioeconomic History  . Marital status: Legally Separated    Spouse name: Not on file  . Number of children: Not on file  . Years of education: Not on file  . Highest education level: Not on file  Occupational History  . Not on file  Social Needs  . Financial resource strain: Not on file  . Food insecurity:    Worry: Not on file    Inability: Not on file  . Transportation needs:    Medical: Not on file    Non-medical: Not on file  Tobacco Use  . Smoking status: Never Smoker  .  Smokeless tobacco: Never Used  Substance and Sexual Activity  . Alcohol use: No  . Drug use: No  . Sexual activity: Yes  Lifestyle  . Physical activity:    Days per week: Not on file    Minutes per session: Not on file  . Stress: Not on file  Relationships  . Social connections:    Talks on phone: Not on file    Gets together: Not on file    Attends religious service: Not on file    Active member of club or organization: Not on file    Attends meetings of clubs or organizations: Not on file    Relationship status: Not on file  Other Topics Concern  . Not on file  Social History Narrative  . Not on file     Family History:  The patient's family history includes Alcohol abuse in his father; Arthritis in his mother; Colon cancer in his paternal grandfather; Tuberculosis in his father; Varicose Veins in his mother.   Review of Systems:   Please see the history of present illness.     General:  No chills, fever, night sweats or weight changes.  Cardiovascular:  No edema, orthopnea, palpitations, paroxysmal nocturnal dyspnea. Positive for chest pain and dyspnea on exertion.  Dermatological: No rash, lesions/masses Respiratory: No cough, dyspnea Urologic: No hematuria, dysuria Abdominal:   No nausea, vomiting, diarrhea, bright red blood per rectum, melena, or hematemesis Neurologic:  No visual changes, wkns, changes in mental status. All other systems reviewed and are otherwise negative except as noted above.   Physical Exam:    VS:  BP 110/76   Pulse 96   Ht 5\' 11"  (1.803 m)   Wt 272 lb (123.4 kg)   SpO2 97%   BMI 37.94 kg/m    General: Well developed, overweight Caucasian male appearing in no acute distress. Head: Normocephalic, atraumatic, sclera non-icteric, no xanthomas, nares are without discharge.  Neck: No carotid bruits. JVD not elevated.  Lungs: Respirations regular and unlabored, without wheezes or rales.  Heart: Regular rate and rhythm. No S3 or S4.  No  murmur, no rubs, or gallops appreciated. Abdomen: Soft, non-tender, non-distended with normoactive bowel sounds. No hepatomegaly. No rebound/guarding. No obvious abdominal masses. Msk:  Strength and tone appear normal for age. No joint deformities or effusions. Extremities: No clubbing or cyanosis. No lower extremity edema.  Distal pedal pulses are 2+ bilaterally. Neuro: Alert and oriented X 3. Moves all extremities spontaneously. No focal deficits noted. Psych:  Responds to questions appropriately with a normal affect. Skin: No rashes or lesions noted  Wt Readings from Last 3 Encounters:  08/01/17 272 lb (123.4 kg)  07/19/17 272 lb 9.6 oz (123.7 kg)  06/20/17 264 lb 12.8 oz (120.1 kg)     Studies/Labs Reviewed:   EKG:  EKG is not ordered today. EKG from recent NST on 07/27/2017 reviewed which shows NSR, HR 71, with no acute ST changes when compared to prior tracings.   Recent Labs: 01/11/2017: TSH 1.33 08/01/2017: BUN 16; Creatinine, Ser 1.11; Hemoglobin 17.8; Platelets 217; Potassium 4.1; Sodium 138   Lipid Panel    Component Value Date/Time   CHOL 146 04/06/2016 1121   TRIG 107 04/06/2016 1121   HDL 36 (L) 04/06/2016 1121   CHOLHDL 4.1 04/06/2016 1121   VLDL 21 04/06/2016 1121   LDLCALC 89 04/06/2016 1121    Additional studies/ records that were reviewed today include:   NST: 06/2017  Blood pressure demonstrated a normal response to exercise.  There was no ST segment deviation noted during stress.  Findings consistent with prior inferior myocardial infarction with mild to moderate peri-infarct ischemia.  The left ventricular ejection fraction is normal (55-65%).  Low to intermediate risk study based on imaging. Duke treadmill score of 8 would suggest low risk for major cardiac events.  Assessment:    1. Chest pain, unspecified type   2. Dyspnea on exertion   3. Abnormal stress test   4. Hypothyroidism, unspecified type      Plan:   In order of problems listed  above:  1. Chest Pain/ Dyspnea on Exertion/ Abnormal Stress Test - The patient was recently referred to Cardiology for evaluation of chest discomfort which he initially thought was secondary to indigestion but he has experienced episodes which radiate into his jaw and are not associated with food consumption. He has also experienced worsening dyspnea on exertion over the past 6+ months. A Treadmill Myoview was performed on 07/27/2017 and showed findings consistent with prior inferior myocardial infarction and mild to moderate peri-infarct ischemia. Dr. Eden Emms recommended a cardiac catheterization for definitive evaluation. The patient has no planned upcoming surgeries or procedures. Was initially scheduled for a screening colonoscopy next week but his wife already cancelled the procedure given the need for a catheterization.  - The procedure was reviewed with the patient and his wife in detail today and he is in agreement to proceed. The patient understands that risks include but are not limited to stroke (1 in 1000), death (1 in 1000), kidney failure [usually temporary] (1 in 500), bleeding (1 in 200), allergic reaction [possibly serious] (1 in 200). Will plan for cardiac catheterization early next week. He is aware that if symptoms worsen in the interim and do not resolve with SL NTG, then he should proceed to the ED for further evaluation.  - will check CBC and BMET today. Continue ASA and BB therapy. HLD is listed in his history but he is not currently on statin therapy. LDL was 86 by review of labs from 03/2017. If noted to have CAD on catheterization, would initiate statin therapy. He has an updated Rx for SL NTG.  2. Hypothyroidism - Followed by PCP. Remains on Armour Thyroid.     Medication Adjustments/Labs and Tests Ordered: Current medicines are reviewed at length with the patient today.  Concerns regarding medicines are outlined above.  Medication changes, Labs and Tests ordered today are  listed in the Patient Instructions below. Patient Instructions  Medication Instructions:   Your physician recommends that you continue on your current medications as directed. Please refer to the Current Medication list given to you today.  Labwork:  Your physician recommends that you return for lab work in: today to check your CBC & BMET.  Testing/Procedures: Your physician has requested that you  have a cardiac catheterization. Cardiac catheterization is used to diagnose and/or treat various heart conditions. Doctors may recommend this procedure for a number of different reasons. The most common reason is to evaluate chest pain. Chest pain can be a symptom of coronary artery disease (CAD), and cardiac catheterization can show whether plaque is narrowing or blocking your heart's arteries. This procedure is also used to evaluate the valves, as well as measure the blood flow and oxygen levels in different parts of your heart. For further information please visit [email or web address]. Please follow instruction sheet, as given.  Follow-Up:  Your physician recommends that you schedule a follow-up appointment in: 1 month.  Any Other Special Instructions Will Be Listed Below (If Applicable).   [place] [facility] [place] [address] [place] [address] Dept: [phone] Loc: [phone]  Kelton PillarJeffrey H Haverland  08/01/2017  You are scheduled for a Cardiac Catheterization on Monday, July 8 with Dr. Cristal Deerhristopher End.  1. Please arrive [facility] the [facility] at [facility]: [address] [place], [place] [address] at 10:00 AM (two hours before your procedure to ensure your preparation). Free valet parking service is available.   Special note: Every effort is made to have your procedure done on time. Please understand that emergencies sometimes delay scheduled procedures.  2. Diet: No solid foods after midnight the prior  to your procedure. You may have clear liquids until 5:00 am on the day of your cath.  3. Labs: BM[facility] & CBC [facility]17 [facility].  4. Medication instructions in preparation for your procedure:  On the morning of your procedure, take your Aspirin 81 mg and any morning medicines NOT listed above.  You may use sips of water.  5. Plan for one night stay--bring personal belongings. 6. Bring a current list of your medications and current insurance cards. 7. You MUST have a responsible person to drive you home. 8. Someone MUST be with you the first 24 hours after you arrive home or your discharge will be delayed. 9. Please wear clothes that are easy to get on and off and wear slip-on shoes.  Thank you for allowing us to care for you!    -- [place] [facility]  If you need a refill on your cardiac medications before your next appointment, please call your pharmacy.    Signed, Ellsworth LennoxBrittany M Strader, PA-C  08/01/2017 4:36 PM    [facility] [address] [place], [place] [address] Phone: [phone]

## 2017-08-01 NOTE — Patient Instructions (Addendum)
Medication Instructions:   Your physician recommends that you continue on your current medications as directed. Please refer to the Current Medication list given to you today.  Labwork:  Your physician recommends that you return for lab work in: today to check your CBC & BMET.  Testing/Procedures: Your physician has requested that you have a cardiac catheterization. Cardiac catheterization is used to diagnose and/or treat various heart conditions. Doctors may recommend this procedure for a number of different reasons. The most common reason is to evaluate chest pain. Chest pain can be a symptom of coronary artery disease (CAD), and cardiac catheterization can show whether plaque is narrowing or blocking your heart's arteries. This procedure is also used to evaluate the valves, as well as measure the blood flow and oxygen levels in different parts of your heart. For further information please visit [email or web address]. Please follow instruction sheet, as given.  Follow-Up:  Your physician recommends that you schedule a follow-up appointment in: 1 month.  Any Other Special Instructions Will Be Listed Below (If Applicable).   [place] [facility] [place] [address] [place] [address] Dept: [phone] Loc: [phone]  Kelton PillarJeffrey H Wolters  08/01/2017  You are scheduled for a Cardiac Catheterization on Monday, July 8 with Dr. Cristal Deerhristopher End.  1. Please [facility] the [facility] at [facility]: [address] [place], [place] [address] at 10:00 AM (two hours before your procedure to ensure your preparation). Free valet parking service is available.   Special note: Every effort is made to have your procedure done on time. Please understand that emergencies sometimes delay scheduled procedures.  2. Diet: No solid foods after midnight the prior to your procedure. You may have clear liquids until 5:00 am on  the day of your cath.  3. Labs: BMET & CBC today 08/01/17 at [facility].  4. Medication instructions in preparation for your procedure:  On the morning of your procedure, take your Aspirin 81 mg and any morning medicines NOT listed above.  You may use sips of water.  5. Plan for one night stay--bring personal belongings. 6. Bring a current list of your medications and current insurance cards. 7. You MUST have a responsible person to drive you home. 8. Someone MUST be with you the first 24 hours after you arrive home or your discharge will be delayed. 9. Please wear clothes that are easy to get on and off and wear slip-on shoes.  Thank you for allowing us to care for you!    -- [place] [facility]  If you need a refill on your cardiac medications before your next appointment, please call your pharmacy.

## 2017-08-03 ENCOUNTER — Telehealth: Payer: Self-pay | Admitting: *Deleted

## 2017-08-03 NOTE — Telephone Encounter (Signed)
Called patient with test results. No answer. Left message to call back.  

## 2017-08-03 NOTE — Telephone Encounter (Signed)
-----   Message from Ellsworth LennoxBrittany M Strader, [place]-C sent at 08/03/2017  7:27 AM EDT ----- Please let the patient know that his pre-procedure labs look stable. Electrolytes and kidney function are within normal limits. Continue with plans for cardiac catheterization next week. Thank you.

## 2017-08-06 ENCOUNTER — Encounter (HOSPITAL_COMMUNITY): Payer: Self-pay

## 2017-08-06 ENCOUNTER — Ambulatory Visit (HOSPITAL_COMMUNITY)
Admission: RE | Disposition: A | Discharge status: Home / Self Care | Payer: Self-pay | Source: Ambulatory Visit | Attending: Internal Medicine

## 2017-08-06 ENCOUNTER — Telehealth: Payer: Self-pay

## 2017-08-06 ENCOUNTER — Ambulatory Visit (HOSPITAL_COMMUNITY)
Admission: RE | Admit: 2017-08-06 | Discharge: 2017-08-06 | Disposition: A | Discharge status: Home / Self Care | Payer: Federal, State, Local not specified - PPO | Source: Ambulatory Visit | Attending: Internal Medicine | Admitting: Internal Medicine

## 2017-08-06 ENCOUNTER — Encounter (HOSPITAL_COMMUNITY)
Admission: RE | Admit: 2017-08-06 | Discharge: 2017-08-06 | Disposition: A | Discharge status: Home / Self Care | Payer: Federal, State, Local not specified - PPO | Source: Ambulatory Visit | Attending: Internal Medicine | Admitting: Internal Medicine

## 2017-08-06 DIAGNOSIS — R079 Chest pain, unspecified: Secondary | ICD-10-CM

## 2017-08-06 DIAGNOSIS — E039 Hypothyroidism, unspecified: Secondary | ICD-10-CM | POA: Insufficient documentation

## 2017-08-06 DIAGNOSIS — R0600 Dyspnea, unspecified: Secondary | ICD-10-CM

## 2017-08-06 DIAGNOSIS — I340 Nonrheumatic mitral (valve) insufficiency: Secondary | ICD-10-CM | POA: Insufficient documentation

## 2017-08-06 DIAGNOSIS — Z6837 Body mass index (BMI) 37.0-37.9, adult: Secondary | ICD-10-CM | POA: Insufficient documentation

## 2017-08-06 DIAGNOSIS — F419 Anxiety disorder, unspecified: Secondary | ICD-10-CM | POA: Insufficient documentation

## 2017-08-06 DIAGNOSIS — E669 Obesity, unspecified: Secondary | ICD-10-CM | POA: Insufficient documentation

## 2017-08-06 DIAGNOSIS — E785 Hyperlipidemia, unspecified: Secondary | ICD-10-CM | POA: Diagnosis not present

## 2017-08-06 DIAGNOSIS — R0789 Other chest pain: Secondary | ICD-10-CM | POA: Insufficient documentation

## 2017-08-06 DIAGNOSIS — F329 Major depressive disorder, single episode, unspecified: Secondary | ICD-10-CM | POA: Diagnosis not present

## 2017-08-06 DIAGNOSIS — R9439 Abnormal result of other cardiovascular function study: Secondary | ICD-10-CM | POA: Diagnosis not present

## 2017-08-06 DIAGNOSIS — G4730 Sleep apnea, unspecified: Secondary | ICD-10-CM | POA: Insufficient documentation

## 2017-08-06 DIAGNOSIS — Z7982 Long term (current) use of aspirin: Secondary | ICD-10-CM | POA: Insufficient documentation

## 2017-08-06 DIAGNOSIS — R0609 Other forms of dyspnea: Secondary | ICD-10-CM

## 2017-08-06 HISTORY — PX: LEFT HEART CATH AND CORONARY ANGIOGRAPHY: CATH118249

## 2017-08-06 SURGERY — LEFT HEART CATH AND CORONARY ANGIOGRAPHY
Anesthesia: LOCAL

## 2017-08-06 MED ORDER — SODIUM CHLORIDE 0.9% FLUSH: 3.0000 mL | INTRAVENOUS | Status: DC | PRN

## 2017-08-06 MED ORDER — LIDOCAINE HCL (PF) 1 % IJ SOLN
INTRAMUSCULAR | Status: AC
  Filled 2017-08-06: qty 30

## 2017-08-06 MED ORDER — SODIUM CHLORIDE 0.9% FLUSH: 3.0000 mL | Freq: Two times a day (BID) | INTRAVENOUS | Status: DC

## 2017-08-06 MED ORDER — ASPIRIN 81 MG PO CHEW
81.0000 mg | CHEWABLE_TABLET | ORAL | Status: AC
  Administered 2017-08-06: 81 mg via ORAL

## 2017-08-06 MED ORDER — HEPARIN (PORCINE) IN NACL 2-0.9 UNITS/ML
INTRAMUSCULAR | Status: AC | PRN
  Administered 2017-08-06 (×2): 500 mL

## 2017-08-06 MED ORDER — FENTANYL CITRATE (PF) 100 MCG/2ML IJ SOLN
INTRAMUSCULAR | Status: AC
  Filled 2017-08-06: qty 2

## 2017-08-06 MED ORDER — SODIUM CHLORIDE 0.9 % IV SOLN: INTRAVENOUS | Status: DC

## 2017-08-06 MED ORDER — MIDAZOLAM HCL 2 MG/2ML IJ SOLN
INTRAMUSCULAR | Status: AC
  Filled 2017-08-06: qty 2

## 2017-08-06 MED ORDER — SODIUM CHLORIDE 0.9 % IV SOLN: 250.0000 mL | INTRAVENOUS | Status: DC | PRN

## 2017-08-06 MED ORDER — FAMOTIDINE 20 MG PO TABS: 20.0000 mg | ORAL_TABLET | Freq: Two times a day (BID) | ORAL | Status: DC

## 2017-08-06 MED ORDER — HEPARIN SODIUM (PORCINE) 1000 UNIT/ML IJ SOLN
INTRAMUSCULAR | Status: DC | PRN
  Administered 2017-08-06: 5000 [IU] via INTRAVENOUS

## 2017-08-06 MED ORDER — ASPIRIN 81 MG PO CHEW
CHEWABLE_TABLET | ORAL | Status: AC
  Administered 2017-08-06: 81 mg via ORAL
  Filled 2017-08-06: qty 1

## 2017-08-06 MED ORDER — HEPARIN (PORCINE) IN NACL 1000-0.9 UT/500ML-% IV SOLN
INTRAVENOUS | Status: AC
  Filled 2017-08-06: qty 500

## 2017-08-06 MED ORDER — LIDOCAINE HCL (PF) 1 % IJ SOLN
INTRAMUSCULAR | Status: DC | PRN
  Administered 2017-08-06: 2 mL

## 2017-08-06 MED ORDER — FENTANYL CITRATE (PF) 100 MCG/2ML IJ SOLN
INTRAMUSCULAR | Status: DC | PRN
  Administered 2017-08-06: 50 ug via INTRAVENOUS

## 2017-08-06 MED ORDER — VERAPAMIL HCL 2.5 MG/ML IV SOLN
INTRAVENOUS | Status: DC | PRN
  Administered 2017-08-06: 10 mL via INTRA_ARTERIAL

## 2017-08-06 MED ORDER — SODIUM CHLORIDE 0.9 % WEIGHT BASED INFUSION: 1.0000 mL/kg/h | INTRAVENOUS | Status: DC

## 2017-08-06 MED ORDER — ACETAMINOPHEN 325 MG PO TABS: 650.0000 mg | ORAL_TABLET | ORAL | Status: DC | PRN

## 2017-08-06 MED ORDER — ONDANSETRON HCL 4 MG/2ML IJ SOLN: 4.0000 mg | Freq: Four times a day (QID) | INTRAMUSCULAR | Status: DC | PRN

## 2017-08-06 MED ORDER — SODIUM CHLORIDE 0.9 % WEIGHT BASED INFUSION
3.0000 mL/kg/h | INTRAVENOUS | Status: DC
  Administered 2017-08-06: 3 mL/kg/h via INTRAVENOUS

## 2017-08-06 MED ORDER — IOHEXOL 350 MG/ML SOLN
INTRAVENOUS | Status: DC | PRN
  Administered 2017-08-06: 100 mL via INTRAVENOUS

## 2017-08-06 MED ORDER — VERAPAMIL HCL 2.5 MG/ML IV SOLN
INTRAVENOUS | Status: AC
  Filled 2017-08-06: qty 2

## 2017-08-06 MED ORDER — HEPARIN SODIUM (PORCINE) 1000 UNIT/ML IJ SOLN
INTRAMUSCULAR | Status: AC
  Filled 2017-08-06: qty 1

## 2017-08-06 MED ORDER — MIDAZOLAM HCL 2 MG/2ML IJ SOLN
INTRAMUSCULAR | Status: DC | PRN
  Administered 2017-08-06: 1 mg via INTRAVENOUS

## 2017-08-06 SURGICAL SUPPLY — 15 items
CATH INFINITI 5 FR 3DRC (CATHETERS) ×1 IMPLANT
CATH INFINITI 5 FR AL2 (CATHETERS) ×1 IMPLANT
CATH INFINITI 5 FR JL3.5 (CATHETERS) ×1 IMPLANT
CATH INFINITI 5FR ANG PIGTAIL (CATHETERS) ×2 IMPLANT
CATH OPTITORQUE TIG 4.0 5F (CATHETERS) ×1 IMPLANT
DEVICE RAD COMP TR BAND LRG (VASCULAR PRODUCTS) ×1 IMPLANT
GLIDESHEATH SLEND A-KIT 6F 22G (SHEATH) ×2 IMPLANT
GUIDEWIRE INQWIRE 1.5J.035X260 (WIRE) IMPLANT
INQWIRE 1.5J .035X260CM (WIRE) ×2
KIT HEART LEFT (KITS) ×2 IMPLANT
PACK CARDIAC CATHETERIZATION (CUSTOM PROCEDURE TRAY) ×2 IMPLANT
SYR MEDRAD MARK V 150ML (SYRINGE) ×2 IMPLANT
TRANSDUCER W/STOPCOCK (MISCELLANEOUS) ×2 IMPLANT
TUBING CIL FLEX 10 FLL-RA (TUBING) ×2 IMPLANT
WIRE MICROINTRODUCER 60CM (WIRE) ×1 IMPLANT

## 2017-08-06 NOTE — Interval H&P Note (Signed)
History and Physical Interval Note:  08/06/2017 1:05 PM  Kelton PillarJeffrey H Krisko  has presented today for cardiac catheterization, with the diagnosis of abnormal stress test and chest pain. The various methods of treatment have been discussed with the patient and family. After consideration of risks, benefits and other options for treatment, the patient has consented to  Procedure(s): LEFT HEART CATH AND CORONARY ANGIOGRAPHY (N/A) as a surgical intervention .  The patient's history has been reviewed, patient examined, no change in status, stable for surgery.  I have reviewed the patient's chart and labs.  Questions were answered to the patient's satisfaction.    Cath Lab Visit (complete for each Cath Lab visit)  Clinical Evaluation Leading to the Procedure:   ACS: No.  Non-ACS:    Anginal Classification: CCS IV  Anti-ischemic medical therapy: Minimal Therapy (1 class of medications)  Non-Invasive Test Results: Intermediate-risk stress test findings: cardiac mortality 1-3%/year  Prior CABG: No previous CABG  Kailene Steinhart

## 2017-08-06 NOTE — Discharge Instructions (Signed)
Drink plenty of fluids over next 48 hours and keep right wrist elevated at heart level for 24 hours ° °Radial Site Care °Refer to this sheet in the next few weeks. These instructions provide you with information about caring for yourself after your procedure. Your health care provider may also give you more specific instructions. Your treatment has been planned according to current medical practices, but problems sometimes occur. Call your health care provider if you have any problems or questions after your procedure. °What can I expect after the procedure? °After your procedure, it is typical to have the following: °· Bruising at the radial site that usually fades within 1-2 weeks. °· Blood collecting in the tissue (hematoma) that may be painful to the touch. It should usually decrease in size and tenderness within 1-2 weeks. ° °Follow these instructions at home: °· Take medicines only as directed by your health care provider. °· You may shower 24-48 hours after the procedure or as directed by your health care provider. Remove the bandage (dressing) and gently wash the site with plain soap and water. Pat the area dry with a clean towel. Do not rub the site, because this may cause bleeding. °· Do not take baths, swim, or use a hot tub until your health care provider approves. °· Check your insertion site every day for redness, swelling, or drainage. °· Do not apply powder or lotion to the site. °· Do not flex or bend the affected arm for 24 hours or as directed by your health care provider. °· Do not push or pull heavy objects with the affected arm for 24 hours or as directed by your health care provider. °· Do not lift over 10 lb (4.5 kg) for 5 days after your procedure or as directed by your health care provider. °· Ask your health care provider when it is okay to: °? Return to work or school. °? Resume usual physical activities or sports. °? Resume sexual activity. °· Do not drive home if you are discharged the  same day as the procedure. Have someone else drive you. °· You may drive 24 hours after the procedure unless otherwise instructed by your health care provider. °· Do not operate machinery or power tools for 24 hours after the procedure. °· If your procedure was done as an outpatient procedure, which means that you went home the same day as your procedure, a responsible adult should be with you for the first 24 hours after you arrive home. °· Keep all follow-up visits as directed by your health care provider. This is important. °Contact a health care provider if: °· You have a fever. °· You have chills. °· You have increased bleeding from the radial site. Hold pressure on the site. °Get help right away if: °· You have unusual pain at the radial site. °· You have redness, warmth, or swelling at the radial site. °· You have drainage (other than a small amount of blood on the dressing) from the radial site. °· The radial site is bleeding, and the bleeding does not stop after 30 minutes of holding steady pressure on the site. °· Your arm or hand becomes pale, cool, tingly, or numb. °This information is not intended to replace advice given to you by your health care provider. Make sure you discuss any questions you have with your health care provider. °Document Released: 02/18/2010 Document Revised: 06/24/2015 Document Reviewed: 08/04/2013 °Elsevier Interactive Patient Education © 2018 Elsevier Inc. ° °

## 2017-08-06 NOTE — Brief Op Note (Signed)
BRIEF CARDIAC CATHETERIZATION NOTE  DATE: 08/06/2017 TIME: 2:13 PM  PATIENT:  Luis PillarJeffrey H Bezek  57 y.o. male  PRE-OPERATIVE DIAGNOSIS:  Atypical chest pain and abnormal stress test  POST-OPERATIVE DIAGNOSIS: Same  PROCEDURE:  Procedure(s): LEFT HEART CATH AND CORONARY ANGIOGRAPHY (N/A)  SURGEON:  Surgeon(s) and Role:    Yvonne Kendall* Janelle Spellman, MD - Primary  FINDING: 1. No angiographically significant CAD. 2. Normal LVEF and LVEDP.  Mild to moderate MR noted on LVgram, which may be related to NSVT.  RECOMMENDATIONS: 1. Primary prevention of CAD. 2. Consider echocardiogram for further assessment of MR. 3. Empiric trial of H2 blocker for possible GERD.  Yvonne Kendallhristopher Aariona Momon, MD [facility] Pager: [phone]

## 2017-08-06 NOTE — Research (Signed)
CADFEM Informed Consent   Subject Name: Luis Randall  Subject met inclusion and exclusion criteria.  The informed consent form, study requirements and expectations were reviewed with the subject and questions and concerns were addressed prior to the signing of the consent form.  The subject verbalized understanding of the trail requirements.  The subject agreed to participate in the CADFEM trial and signed the informed consent.  The informed consent was obtained prior to performance of any protocol-specific procedures for the subject.  A copy of the signed informed consent was given to the subject and a copy was placed in the subject's medical record.  Neva Seat 08/06/2017, 11:10 AM

## 2017-08-06 NOTE — Telephone Encounter (Signed)
Endo scheduler called office, TCS w/Propofol cancelled for 08/09/17 d/t he is having cardiac issues. [facility]

## 2017-08-07 ENCOUNTER — Encounter (HOSPITAL_COMMUNITY): Payer: Self-pay | Admitting: Internal Medicine

## 2017-08-07 MED FILL — Heparin Sod (Porcine)-NaCl IV Soln 1000 Unit/500ML-0.9%: INTRAVENOUS | Qty: 500 | Status: AC

## 2017-08-09 ENCOUNTER — Ambulatory Visit (HOSPITAL_COMMUNITY)
Admission: RE | Admit: 2017-08-09 | Payer: Federal, State, Local not specified - PPO | Source: Ambulatory Visit | Admitting: Internal Medicine

## 2017-08-09 ENCOUNTER — Encounter (HOSPITAL_COMMUNITY): Admission: RE | Payer: Self-pay | Source: Ambulatory Visit

## 2017-08-09 SURGERY — COLONOSCOPY WITH PROPOFOL
Anesthesia: Monitor Anesthesia Care

## 2017-08-13 NOTE — Telephone Encounter (Signed)
Noted  

## 2017-09-05 ENCOUNTER — Ambulatory Visit: Payer: Federal, State, Local not specified - PPO | Admitting: Student

## 2017-09-05 ENCOUNTER — Encounter: Payer: Self-pay | Admitting: Student

## 2017-09-05 VITALS — BP 130/80 | HR 88 | Ht 71.0 in | Wt 276.0 lb

## 2017-09-05 DIAGNOSIS — I340 Nonrheumatic mitral (valve) insufficiency: Secondary | ICD-10-CM | POA: Diagnosis not present

## 2017-09-05 DIAGNOSIS — R9439 Abnormal result of other cardiovascular function study: Secondary | ICD-10-CM | POA: Diagnosis not present

## 2017-09-05 DIAGNOSIS — E039 Hypothyroidism, unspecified: Secondary | ICD-10-CM | POA: Diagnosis not present

## 2017-09-05 NOTE — Patient Instructions (Signed)
Medication Instructions:  Your physician recommends that you continue on your current medications as directed. Please refer to the Current Medication list given to you today.   Labwork: NONE   Testing/Procedures: Your physician has requested that you have an echocardiogram. Echocardiography is a painless test that uses sound waves to create images of your heart. It provides your doctor with information about the size and shape of your heart and how well your heart's chambers and valves are working. This procedure takes approximately one hour. There are no restrictions for this procedure.    Follow-Up: Your physician wants you to follow-up in: 6 Months. You will receive a reminder letter in the mail two months in advance. If you don't receive a letter, please call our office to schedule the follow-up appointment.   Any Other Special Instructions Will Be Listed Below (If Applicable).     If you need a refill on your cardiac medications before your next appointment, please call your pharmacy. Thank you for choosing [facility]!   

## 2017-09-05 NOTE — Progress Notes (Signed)
Cardiology Office Note    Date:  09/05/2017   ID:  Luis Randall, DOB [birth year], MRN [id number]  PCP:  Benita Stabile, MD  Cardiologist: Charlton Haws, MD    Chief Complaint  Patient presents with  . Follow-up    s/p cardiac catheterization    History of Present Illness:    Luis Randall is a 57 y.o. male with past medical history of HLD, Hypothyroidism, and obesity who presents to the office today for follow-up from his recent cardiac catheterization.  He had been examined by Dr. Eden Emms in 06/2017 and reported episodes of chest discomfort for several days prior to his office visit. A Treadmill Myoview study was obtained and showed mild to moderate peri-infarct ischemia and was overall an intermediate risk study. Given his recent symptoms, a cardiac catheterization was recommended for definitive evaluation. This was performed by Dr. Okey Dupre on 08/06/2017 and showed no angiographically significant CAD with normal LV function and mild to moderate MR. Primary prevention of CAD was recommended and to consider empiric treatment for GERD and a possible outpatient echocardiogram for reassessment of mitral regurgitation.  In talking with the patient today, he reports overall doing well from a cardiac perspective since his recent cardiac catheterization. He denies any recurrent episodes of chest discomfort. He does have baseline dyspnea on exertion which has worsened over the past several months in the setting of a 40+ pound weight gain.  Reports he has experienced increased stress in his life due to his son eating back home. Denies any associated orthopnea, PND, or lower extremity edema in the setting of his weight gain.  Reports he was recently diagnosed with bipolar disorder by his PCP and was started on Cariprazine.    Past Medical History:  Diagnosis Date  . Anxiety   . Chest pain    a. 07/2017: cath showing no angiographically significant CAD with normal LV function and mild to moderate MR.    . Depression   . Sleep apnea   . Thyroid disease     Past Surgical History:  Procedure Laterality Date  . APPENDECTOMY  2006  . ELBOW SURGERY Right 2010   release  . ENDOSCOPIC VEIN LASER TREATMENT Bilateral 2005  . LEFT HEART CATH AND CORONARY ANGIOGRAPHY N/A 08/06/2017   Procedure: LEFT HEART CATH AND CORONARY ANGIOGRAPHY;  Surgeon: Yvonne Kendall, MD;  [facility];  Service: Cardiovascular;  Laterality: N/A;  . SHOULDER SURGERY Right 2011  . VARICOSE VEIN SURGERY Bilateral 1995    Current Medications: Outpatient Medications Prior to Visit  Medication Sig Dispense Refill  . ARMOUR THYROID 120 MG tablet TAKE (1) TABLET BY MOUTH ONCE DAILY. 30 tablet 0  . clonazePAM (KLONOPIN) 0.5 MG tablet Take 1 tablet (0.5 mg total) by mouth 3 (three) times daily as needed for anxiety. 90 tablet 2  . famotidine (PEPCID) 20 MG tablet Take 1 tablet (20 mg total) by mouth 2 (two) times daily.    . metoprolol tartrate (LOPRESSOR) 25 MG tablet Take 0.5 tablets (12.5 mg total) by mouth 2 (two) times daily. 45 tablet 6  . nitroGLYCERIN (NITROSTAT) 0.4 MG SL tablet Place 1 tablet (0.4 mg total) under the tongue every 5 (five) minutes as needed. (Patient taking differently: Place 0.4 mg under the tongue every 5 (five) minutes as needed for chest pain. ) 25 tablet 3  . tiZANidine (ZANAFLEX) 4 MG tablet Take 1 tablet (4 mg total) by mouth every 8 (eight) hours as needed for muscle  spasms. 60 tablet 2  . VRAYLAR capsule      No facility-administered medications prior to visit.      Allergies:   Patient has no known allergies.   Social History   Socioeconomic History  . Marital status: Legally Separated    Spouse name: Not on file  . Number of children: Not on file  . Years of education: Not on file  . Highest education level: Not on file  Occupational History  . Not on file  Social Needs  . Financial resource strain: Not on file  . Food insecurity:    Worry: Not on file     Inability: Not on file  . Transportation needs:    Medical: Not on file    Non-medical: Not on file  Tobacco Use  . Smoking status: Never Smoker  . Smokeless tobacco: Never Used  Substance and Sexual Activity  . Alcohol use: No  . Drug use: No  . Sexual activity: Yes  Lifestyle  . Physical activity:    Days per week: Not on file    Minutes per session: Not on file  . Stress: Not on file  Relationships  . Social connections:    Talks on phone: Not on file    Gets together: Not on file    Attends religious service: Not on file    Active member of club or organization: Not on file    Attends meetings of clubs or organizations: Not on file    Relationship status: Not on file  Other Topics Concern  . Not on file  Social History Narrative  . Not on file     Family History:  The patient's family history includes Alcohol abuse in his father; Arthritis in his mother; Colon cancer in his paternal grandfather; Tuberculosis in his father; Varicose Veins in his mother.   Review of Systems:   Please see the history of present illness.     General:  No chills, fever, or night sweats. Positive for weight gain.  Cardiovascular:  No chest pain, edema, orthopnea, palpitations, paroxysmal nocturnal dyspnea. Positive for dyspnea on exertion.  Dermatological: No rash, lesions/masses Respiratory: No cough, dyspnea Urologic: No hematuria, dysuria Abdominal:   No nausea, vomiting, diarrhea, bright red blood per rectum, melena, or hematemesis Neurologic:  No visual changes, wkns, changes in mental status. All other systems reviewed and are otherwise negative except as noted above.   Physical Exam:    VS:  BP 130/80   Pulse 88   Ht 5\' 11"  (1.803 m)   Wt 276 lb (125.2 kg)   SpO2 95%   BMI 38.49 kg/m    General: Well developed, well nourished Caucasian male appearing in no acute distress. Head: Normocephalic, atraumatic, sclera non-icteric, no xanthomas, nares are without discharge.  Neck:  No carotid bruits. JVD not elevated.  Lungs: Respirations regular and unlabored, without wheezes or rales.  Heart: Regular rate and rhythm. No S3 or S4.  No murmur, no rubs, or gallops appreciated. Abdomen: Soft, non-tender, non-distended with normoactive bowel sounds. No hepatomegaly. No rebound/guarding. No obvious abdominal masses. Msk:  Strength and tone appear normal for age. No joint deformities or effusions. Extremities: No clubbing or cyanosis. No lower extremity edema.  Distal pedal pulses are 2+ bilaterally. Neuro: Alert and oriented X 3. Moves all extremities spontaneously. No focal deficits noted. Psych:  Responds to questions appropriately with a normal affect. Skin: No rashes or lesions noted  Wt Readings from Last 3 Encounters:  09/05/17  276 lb (125.2 kg)  08/06/17 273 lb (123.8 kg)  08/01/17 272 lb (123.4 kg)     Studies/Labs Reviewed:   EKG:  EKG is not ordered today.   Recent Labs: 01/11/2017: TSH 1.33 08/01/2017: BUN 16; Creatinine, Ser 1.11; Hemoglobin 17.8; Platelets 217; Potassium 4.1; Sodium 138   Lipid Panel    Component Value Date/Time   CHOL 146 04/06/2016 1121   TRIG 107 04/06/2016 1121   HDL 36 (L) 04/06/2016 1121   CHOLHDL 4.1 04/06/2016 1121   VLDL 21 04/06/2016 1121   LDLCALC 89 04/06/2016 1121    Additional studies/ records that were reviewed today include:   Cardiac Catheterization: 08/06/2017 Conclusions: 1. No angiographically significant coronary artery disease. 2. Normal left ventricular contraction with mildly elevated filling pressure. 3. Mild to moderate mitral regurgitation that may be related to catheterization-induced non-sustained ventricular tachycardia. 4. Significant right subclavian/brachiocephalic artery tortuosity precluding selective engagement of the right coronary artery via the right radial approach.  Consider alternate access if catheterization is attempted in the future.  Recommendations: 1. Primary prevention of  coronary artery disease. 2. Consider alternative causes of atypical chest pain.  Recommend empiric treatment for GERD. 3. Consider outpatient echocardiogram to reevaluate for presence of mitral regurgitations.   Assessment:    1. Abnormal stress test   2. Mitral valve insufficiency, unspecified etiology   3. Hypothyroidism, unspecified type      Plan:   In order of problems listed above:  1. Abnormal Stress Test - Stress testing in 06/2017 showed mild to moderate peri-infarct ischemia and was overall an intermediate risk study. Underwent a cardiac catheterization on 08/06/2017 which showed no angiographically significant CAD with normal LV function. - Catheterization results were reviewed with the patient and his wife at today's visit. He denies any recurrent chest discomfort. Has been started on Pepcid for GERD. He does continue to have dyspnea on exertion which is likely due to deconditioning and weight gain as weight has increased over 40 pounds within the past several months due to recent diet changes. Strategies for weight loss were reviewed. He plans to resume walking with his wife on a daily basis for increased exercise.   2. Mitral regurgitation - Noted to have mild to moderate MR by cardiac catheterization. No significant murmur is appreciated on examination today. Will plan for an echocardiogram for definitive evaluation.  3. Hypothyroidism - Followed by PCP. Remains on Armour Thyroid 120 mg daily.   Medication Adjustments/Labs and Tests Ordered: Current medicines are reviewed at length with the patient today.  Concerns regarding medicines are outlined above.  Medication changes, Labs and Tests ordered today are listed in the Patient Instructions below. Patient Instructions  Medication Instructions:  Your physician recommends that you continue on your current medications as directed. Please refer to the Current Medication list given to you today.  Labwork: NONE    Testing/Procedures: Your physician has requested that you have an echocardiogram. Echocardiography is a painless test that uses sound waves to create images of your heart. It provides your doctor with information about the size and shape of your heart and how well your heart's chambers and valves are working. This procedure takes approximately one hour. There are no restrictions for this procedure.  Follow-Up: Your physician wants you to follow-up in: 6 Months.  You will receive a reminder letter in the mail two months in advance. If you don't receive a letter, please call our office to schedule the follow-up appointment.  Any Other Special Instructions Will  Be Listed Below (If Applicable).  If you need a refill on your cardiac medications before your next appointment, please call your pharmacy. Thank you for choosing [facility]!    Signed, Luis Lennox, PA-C  09/05/2017 5:09 PM    [facility] [address] [place], [place] [address] Phone: [phone]

## 2017-09-06 ENCOUNTER — Telehealth: Payer: Self-pay | Admitting: Orthopedic Surgery

## 2017-09-06 NOTE — Telephone Encounter (Signed)
Patient called to get an appointment with Dr. Romeo AppleHarrison about a new problem his L Shoulder. During a lengthy conversation the patient stated he may contact the Civil Service and get them to cover this. I told him that if he was going to do that then we would need to talk with them before scheduling an appointment. He then stated he would just go ahead and use his private insurance since he is retired and if he needed to later on he would attain a Clinical research associatelawyer and get him to deal with the Tax inspectorCivil Service. He also mentioned that he would contact the doctor he saw in [place] to send his records, but that is just to have them for reference.

## 2017-09-24 ENCOUNTER — Other Ambulatory Visit: Payer: Self-pay | Admitting: Physician Assistant

## 2017-09-24 NOTE — Telephone Encounter (Signed)
Last OV 01/11/2017 This medication is not on patient list  He was on this in the past ok to refill?

## 2017-09-24 NOTE — Telephone Encounter (Signed)
Needs OV.   I have not done labs etc. to monitor this. Denied.

## 2017-09-28 ENCOUNTER — Ambulatory Visit (HOSPITAL_COMMUNITY)
Admission: RE | Admit: 2017-09-28 | Discharge: 2017-09-28 | Disposition: A | Discharge status: Home / Self Care | Payer: Federal, State, Local not specified - PPO | Source: Ambulatory Visit | Attending: Student | Admitting: Student

## 2017-09-28 DIAGNOSIS — E785 Hyperlipidemia, unspecified: Secondary | ICD-10-CM | POA: Diagnosis not present

## 2017-09-28 DIAGNOSIS — E669 Obesity, unspecified: Secondary | ICD-10-CM | POA: Insufficient documentation

## 2017-09-28 DIAGNOSIS — I340 Nonrheumatic mitral (valve) insufficiency: Secondary | ICD-10-CM

## 2017-09-28 DIAGNOSIS — I081 Rheumatic disorders of both mitral and tricuspid valves: Secondary | ICD-10-CM | POA: Insufficient documentation

## 2017-09-28 NOTE — Progress Notes (Signed)
*  PRELIMINARY RESULTS* Echocardiogram 2D Echocardiogram has been performed.  Stacey DrainWhite, [place] J 09/28/2017, 9:22 AM

## 2017-10-02 ENCOUNTER — Telehealth: Payer: Self-pay | Admitting: *Deleted

## 2017-10-02 NOTE — Telephone Encounter (Signed)
Called patient with test results. No answer. Left message to call back.  

## 2017-10-02 NOTE — Telephone Encounter (Signed)
-----   Message from Luis Randall, [place] sent at 09/28/2017 11:06 AM EDT ----- Please let the patient know his echocardiogram showed normal pumping function of the heart with no wall motion abnormalities. There was mild leakage along the mitral valve which we will continue to follow. Please forward a copy to Benita Stabile, MD. Thank you.

## 2017-10-03 ENCOUNTER — Telehealth: Payer: Self-pay | Admitting: Student

## 2017-10-03 NOTE — Telephone Encounter (Signed)
Called wanting Echo results

## 2017-10-03 NOTE — Telephone Encounter (Signed)
Results given to pt,copied pcp 
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
## 2017-10-15 ENCOUNTER — Ambulatory Visit (INDEPENDENT_AMBULATORY_CARE_PROVIDER_SITE_OTHER): Payer: Federal, State, Local not specified - PPO | Admitting: Psychiatry

## 2017-10-15 ENCOUNTER — Ambulatory Visit: Payer: Federal, State, Local not specified - PPO | Admitting: Orthopedic Surgery

## 2017-10-15 ENCOUNTER — Encounter (INDEPENDENT_AMBULATORY_CARE_PROVIDER_SITE_OTHER): Payer: Self-pay

## 2017-10-15 ENCOUNTER — Encounter (HOSPITAL_COMMUNITY): Payer: Self-pay | Admitting: Psychiatry

## 2017-10-15 VITALS — BP 128/86 | HR 72 | Ht 71.0 in | Wt 277.0 lb

## 2017-10-15 DIAGNOSIS — F0630 Mood disorder due to known physiological condition, unspecified: Secondary | ICD-10-CM | POA: Diagnosis not present

## 2017-10-15 DIAGNOSIS — F410 Panic disorder [episodic paroxysmal anxiety] without agoraphobia: Secondary | ICD-10-CM | POA: Diagnosis not present

## 2017-10-15 DIAGNOSIS — Z811 Family history of alcohol abuse and dependence: Secondary | ICD-10-CM

## 2017-10-15 DIAGNOSIS — Z62810 Personal history of physical and sexual abuse in childhood: Secondary | ICD-10-CM | POA: Diagnosis not present

## 2017-10-15 DIAGNOSIS — F419 Anxiety disorder, unspecified: Secondary | ICD-10-CM | POA: Diagnosis not present

## 2017-10-15 DIAGNOSIS — G4700 Insomnia, unspecified: Secondary | ICD-10-CM

## 2017-10-15 DIAGNOSIS — Z91410 Personal history of adult physical and sexual abuse: Secondary | ICD-10-CM

## 2017-10-15 DIAGNOSIS — R45851 Suicidal ideations: Secondary | ICD-10-CM

## 2017-10-15 MED ORDER — CLONAZEPAM 0.5 MG PO TABS: 0.5000 mg | ORAL_TABLET | Freq: Three times a day (TID) | ORAL | 0 refills | Status: DC | PRN

## 2017-10-15 MED ORDER — LAMOTRIGINE 25 MG PO TABS: ORAL_TABLET | ORAL | 0 refills | Status: DC

## 2017-10-15 NOTE — Patient Instructions (Signed)
1. Start lamotrigine 25 mg daily for two weeks, then 50 mg daily  2. Return to clinic in one month for 30 mins 3. Referral to therapy 4. CONTACT INFORMATION  What to do if you need to get in touch with someone regarding a psychiatric issue:  1. EMERGENCY: For psychiatric emergencies (if you are suicidal or if there are any other safety issues) call 911 and/or go to your nearest Emergency Room immediately.   2. IF YOU NEED SOMEONE TO TALK TO RIGHT NOW: Given my clinical responsibilities, I may not be able to speak with you over the phone for a prolonged period of time.  a. You may always call The National Suicide Prevention Lifeline at 1-800-273-TALK 520-568-3269(8255).  b. Your county of residence will also have local crisis services. [place] [facility]: [facility] at [phone] (24 Hour Crisis Hotline)

## 2017-11-01 ENCOUNTER — Ambulatory Visit (INDEPENDENT_AMBULATORY_CARE_PROVIDER_SITE_OTHER): Payer: Federal, State, Local not specified - PPO | Admitting: Licensed Clinical Social Worker

## 2017-11-01 ENCOUNTER — Encounter (HOSPITAL_COMMUNITY): Payer: Self-pay | Admitting: Licensed Clinical Social Worker

## 2017-11-01 DIAGNOSIS — F0630 Mood disorder due to known physiological condition, unspecified: Secondary | ICD-10-CM
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
## 2017-11-16 ENCOUNTER — Encounter (HOSPITAL_COMMUNITY): Payer: Self-pay | Admitting: Psychiatry

## 2017-11-16 ENCOUNTER — Ambulatory Visit (INDEPENDENT_AMBULATORY_CARE_PROVIDER_SITE_OTHER): Payer: Federal, State, Local not specified - PPO | Admitting: Psychiatry

## 2017-11-16 VITALS — BP 136/91 | HR 71 | Ht 71.0 in | Wt 277.0 lb

## 2017-11-16 DIAGNOSIS — G4700 Insomnia, unspecified: Secondary | ICD-10-CM | POA: Diagnosis not present

## 2017-11-16 DIAGNOSIS — F419 Anxiety disorder, unspecified: Secondary | ICD-10-CM | POA: Diagnosis not present

## 2017-11-16 DIAGNOSIS — F0630 Mood disorder due to known physiological condition, unspecified: Secondary | ICD-10-CM

## 2017-11-16 MED ORDER — CLONAZEPAM 0.5 MG PO TABS: 0.5000 mg | ORAL_TABLET | Freq: Three times a day (TID) | ORAL | 0 refills | Status: DC | PRN

## 2017-11-16 MED ORDER — DIVALPROEX SODIUM ER 250 MG PO TB24: ORAL_TABLET | ORAL | 0 refills | Status: DC

## 2017-11-16 NOTE — Patient Instructions (Signed)
1. Discontinue lamotrigine  2. Start valproic acid 500 mg at night for three days, then 750 mg at night  3. Continue clonazepam 0.5 mg 3 times a day as needed for anxiety 4. Return to clinic in one month for 30 mins 5. Check blood test (depakote, platelet, liver function test)

## 2017-11-30 ENCOUNTER — Ambulatory Visit: Payer: Federal, State, Local not specified - PPO | Admitting: Orthopedic Surgery

## 2017-11-30 ENCOUNTER — Encounter: Payer: Self-pay | Admitting: Orthopedic Surgery
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
## 2017-12-19 ENCOUNTER — Encounter: Payer: Self-pay | Admitting: Orthopedic Surgery

## 2017-12-19 ENCOUNTER — Ambulatory Visit: Payer: Federal, State, Local not specified - PPO | Admitting: Orthopedic Surgery

## 2017-12-19 ENCOUNTER — Ambulatory Visit (INDEPENDENT_AMBULATORY_CARE_PROVIDER_SITE_OTHER): Payer: Federal, State, Local not specified - PPO | Admitting: Psychiatry

## 2017-12-19 VITALS — BP 121/88 | HR 68 | Ht 71.0 in | Wt 275.0 lb

## 2017-12-19 DIAGNOSIS — F3132 Bipolar disorder, current episode depressed, moderate: Secondary | ICD-10-CM

## 2017-12-19 MED ORDER — CLONAZEPAM 0.5 MG PO TABS: 0.5000 mg | ORAL_TABLET | Freq: Three times a day (TID) | ORAL | 0 refills | Status: DC | PRN

## 2017-12-19 MED ORDER — LURASIDONE HCL 20 MG PO TABS: 20.0000 mg | ORAL_TABLET | Freq: Every day | ORAL | 0 refills | Status: DC

## 2017-12-19 NOTE — Progress Notes (Deleted)
Routine office visit:    No chief complaint on file.   Last note:  MEDICAL DECISION SECTION  xrays ordered? y  My independent reading of xrays: See dictated report the patient has patellofemoral arthritis with no tibiofemoral arthritis   No diagnosis found.   PLAN: Aspiration injection left knee Start diclofenac 75 twice daily Recommend weight loss Return in 6 months  No orders of the defined types were placed in this encounter.  Injection? y Procedure note injection and aspiration left knee joint  Verbal consent was obtained to aspirate and inject the left knee joint   Timeout was completed to confirm the site of aspiration and injection  An 18-gauge needle was used to aspirate the left knee joint from a suprapatellar lateral approach.  The medications used were 40 mg of Depo-Medrol and 1% lidocaine 3 cc  Anesthesia was provided by ethyl chloride and the skin was prepped with alcohol.  After cleaning the skin with alcohol an 18-gauge needle was used to aspirate the right knee joint.  We obtained 24 cc of fluid, clear yellow   We followed this by injection of 40 mg of Depo-Medrol and 3 cc 1% lidocaine.  There were no complications. A sterile bandage was applied.   No chief complaint on file.   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  He complains of dull anterior knee pain on the left with pain associated with stairclimbing unrelieved by Aleve   Review of Systems  All other systems reviewed and are negative.    Past Medical History:  Diagnosis Date  . Anxiety   . Chest pain    a. 07/2017: cath showing no angiographically significant CAD with normal LV function and mild to moderate MR.  . Depression   . Sleep apnea   . Thyroid disease     Past Surgical  History:  Procedure Laterality Date  . APPENDECTOMY  2006  . ELBOW SURGERY Right 2010   release  . ENDOSCOPIC VEIN LASER TREATMENT Bilateral 2005  . LEFT HEART CATH AND CORONARY ANGIOGRAPHY N/A 08/06/2017   Procedure: LEFT HEART CATH AND CORONARY ANGIOGRAPHY;  Surgeon: Yvonne Kendall, MD;  Location: [facility];  Service: Cardiovascular;  Laterality: N/A;  . SHOULDER SURGERY Right 2011  . VARICOSE VEIN SURGERY Bilateral 1995    Family History  Problem Relation Age of Onset  . Arthritis Mother   . Varicose Veins Mother   . Alcohol abuse Father   . Tuberculosis Father   . Colon cancer Paternal Grandfather        Maybe...versus pancreatic cancer   Social History   Tobacco Use  . Smoking status: Never Smoker  . Smokeless tobacco: Never Used  Substance Use Topics  . Alcohol use: No  . Drug use: No    No outpatient medications have been marked as taking for the 12/19/17 encounter (Appointment) with Vickki Hearing, MD.    There were no vitals taken for this visit.  Physical Exam  Constitutional: He is oriented to person, place, and time. He appears well-developed and well-nourished.  Vital signs have been reviewed and are stable. Gen. appearance the patient is well-developed and well-nourished with normal grooming and hygiene.   Musculoskeletal:       Left knee: He exhibits  effusion.  Neurological: He is alert and oriented to person, place, and time.  Skin: Skin is warm and dry. No erythema.  Psychiatric: He has a normal mood and affect.  Vitals reviewed.   Right Knee Exam   Muscle Strength  The patient has normal right knee strength.  Tenderness  The patient is experiencing no tenderness.   Range of Motion  Extension: normal  Flexion: normal   Tests  McMurray:  Medial - negative Lateral - negative Varus: negative Valgus: negative Drawer:  Anterior - negative    Posterior - negative  Other  Erythema: absent Scars: absent Sensation:  normal Pulse: present Swelling: none   Left Knee Exam   Muscle Strength  The patient has normal left knee strength.  Tenderness  Left knee tenderness location: Patellofemoral crepitance and pain.  Range of Motion  Extension:  5 normal  Flexion: normal Left knee flexion: 125.   Tests  McMurray:  Medial - negative Lateral - negative Varus: negative Valgus: negative Drawer:  Anterior - negative     Posterior - negative  Other  Erythema: absent Scars: absent Sensation: normal Pulse: present Swelling: none Effusion: effusion present

## 2017-12-19 NOTE — Patient Instructions (Signed)
1. Discontinue depakote 2. Start latuda 20 mg daily  (Blue cross blue shield) 3. Continueclonazepam 0.5 mg 3 times a day as needed for anxiety 4. Return to clinic in one month for 30 mins

## 2017-12-24 ENCOUNTER — Encounter (HOSPITAL_COMMUNITY): Payer: Self-pay | Admitting: Licensed Clinical Social Worker

## 2017-12-24 ENCOUNTER — Telehealth (HOSPITAL_COMMUNITY): Payer: Self-pay | Admitting: *Deleted

## 2017-12-24 ENCOUNTER — Ambulatory Visit (INDEPENDENT_AMBULATORY_CARE_PROVIDER_SITE_OTHER): Payer: Federal, State, Local not specified - PPO | Admitting: Licensed Clinical Social Worker

## 2017-12-24 DIAGNOSIS — F0630 Mood disorder due to known physiological condition, unspecified: Secondary | ICD-10-CM

## 2017-12-24 NOTE — Telephone Encounter (Signed)
Could you contact the pharmaceutical to see if he can get any assist with this?

## 2017-12-24 NOTE — Telephone Encounter (Signed)
Dr Vanetta ShawlHisada I called  [phone] [facility] for [place]. If a patient has any type of Insurance Prescription Coverage Drug Plan -- They will not qualify

## 2017-12-24 NOTE — Telephone Encounter (Signed)
Dr Vanetta ShawlHisada  Patient stopped by after therapy appointment stating that he can't afford the Latuda the co-pay is  $700 & Because he has [employer] loss adjusterederal BC/BS the co pay card wouldn't apply. Same thing with the Vraylar. Looking for a more cost effective medication

## 2017-12-24 NOTE — Telephone Encounter (Signed)
Patient requested to be on a more cost effective medication
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
## 2017-12-25 ENCOUNTER — Other Ambulatory Visit (HOSPITAL_COMMUNITY): Payer: Self-pay | Admitting: Psychiatry

## 2017-12-25 MED ORDER — OLANZAPINE 2.5 MG PO TABS: 2.5000 mg | ORAL_TABLET | Freq: Every day | ORAL | 1 refills | Status: DC

## 2017-12-25 NOTE — Telephone Encounter (Signed)
Discussed with the patient. He did not take latuda samples. He agrees to try olanzapine. Discussed potential metabolic side effect, drowsiness. - Discontinue latuda - Start olanzapine 2.5 mg at night

## 2018-01-16 ENCOUNTER — Other Ambulatory Visit (HOSPITAL_COMMUNITY): Payer: Self-pay | Admitting: Psychiatry

## 2018-01-16 MED ORDER — CLONAZEPAM 0.5 MG PO TABS: 0.5000 mg | ORAL_TABLET | Freq: Three times a day (TID) | ORAL | 1 refills | Status: DC | PRN

## 2018-01-28 ENCOUNTER — Encounter (HOSPITAL_COMMUNITY): Payer: Self-pay | Admitting: Licensed Clinical Social Worker

## 2018-01-28 ENCOUNTER — Ambulatory Visit (INDEPENDENT_AMBULATORY_CARE_PROVIDER_SITE_OTHER): Payer: Federal, State, Local not specified - PPO | Admitting: Licensed Clinical Social Worker

## 2018-01-28 DIAGNOSIS — F0630 Mood disorder due to known physiological condition, unspecified: Secondary | ICD-10-CM

## 2018-01-28 NOTE — Progress Notes (Signed)
   THERAPIST PROGRESS NOTE  Session Time: 9:40 am-10:25 am  Participation Level: Active  Behavioral Response: CasualAlertAngry  Type of Therapy: Individual Therapy  Treatment Goals addressed: Coping  Interventions: CBT and Solution Focused  Summary: Luis Randall is a 57 y.o. male who presents with oriented x5 (person, place,situation, time, and object), casually dressed, appropriately groomed, average height, average, weight, and cooperative to address mood. Patient has a history of medical treatment including hypothyroidism, sleep apnea, and chronic fatigue.  Patient has a history of mental health treatment including outpatient therapy and medication management. Patient denies psychosis including auditory and visual hallucinations. Patient denies substance abuse. Patient is at low risk for lethality at time.  Physically: Patient is tired. He is doing the the [employer] Omaha[employer]eto diet. Patient lost 12 lbs but feels like he gained it all back during the last week.  Spiritually/values: No issues identified.  Relationships: Patient is struggling with his spouse. He feels like she is taking advantage of his kindness including buying her a new phone and fitness watch. This was not what they agreed upon before getting married. He also feels like she makes comments to him to try to start an argument. He feels like she told him that she was a Publishing rights managernurse practitioner  and got a pension/retirement but patient couldn't find any information to confirm this. Patient feels like several things his Emotionally/Mentally/Behavior: Patient's mood is tolerable. He is trying to work on accepting things for how they are including his relationship with his children and his spouse's comments toward him.  Suicidal/Homicidal: Negativewithout intent/plan  Therapist Response: Therapist reviewed patient's recent thoughts and behaviors. Therapist utilized CBT to address mood. Therapist processed patient's feelings to identify triggers for  mood. Therapist discussed patient's relationship with his spouse and working on acceptance.   Plan: Return again in 3 weeks.  Diagnosis: Axis I: Mood disorder in conditions classifed elsewhere    Axis II: No diagnosis    Bynum BellowsJoshua Latasha Puskas, LCSW 01/28/2018
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
## 2018-02-18 ENCOUNTER — Ambulatory Visit (INDEPENDENT_AMBULATORY_CARE_PROVIDER_SITE_OTHER): Payer: Federal, State, Local not specified - PPO | Admitting: Licensed Clinical Social Worker

## 2018-02-18 ENCOUNTER — Encounter (HOSPITAL_COMMUNITY): Payer: Self-pay | Admitting: Licensed Clinical Social Worker

## 2018-02-18 DIAGNOSIS — F0630 Mood disorder due to known physiological condition, unspecified: Secondary | ICD-10-CM | POA: Diagnosis not present

## 2018-02-18 NOTE — Progress Notes (Signed)
   THERAPIST PROGRESS NOTE  Session Time: 10:00 am-10:45 am  Participation Level: Active  Behavioral Response: CasualAlertAngry  Type of Therapy: Family Therapy  Treatment Goals addressed: Coping  Interventions: CBT and Solution Focused  Summary: Luis Randall is a 58 y.o. male who presents with oriented x5 (person, place,situation, time, and object), casually dressed, appropriately groomed, average height, average, weight, and cooperative to address mood. Patient has a history of medical treatment including hypothyroidism, sleep apnea, and chronic fatigue.  Patient has a history of mental health treatment including outpatient therapy and medication management. Patient denies psychosis including auditory and visual hallucinations. Patient denies substance abuse. Patient is at low risk for lethality at time.  Physically: Patient is no longer doing Keto. He has been enjoying sweets and bread too much.  Spiritually/values: Patient has not been reading the Bible, or doing what he needs to do spiritually. He feels some anger toward God because things in his life didn't turn out how he expected them to. He feels like a failure.  Relationships: Patient is getting along with his spouse. He has a strained relationship with his children. He feels like they don't make an effort to see or talk to him so he isn't going to make effort to do the same.  Emotionally/Mentally/Behavior: Patient's mood is tolerable. Patient's wife noted that he procrastinates in doing things. Patient agreed. Patient also noted that his negative thinking keeps him stuck. After discussion, patient agreed to work on his spirituality and to work on challenging his thinking.  Suicidal/Homicidal: Negativewithout intent/plan  Therapist Response: Therapist reviewed patient's recent thoughts and behaviors. Therapist utilized CBT to address mood. Therapist processed patient's feelings to identify triggers for mood. Therapist discussed  with patient his negative thinking and how it impacts his relationships, spirituality and his ability to move forward in life.   Plan: Return again in 3 weeks.  Diagnosis: Axis I: Mood disorder in conditions classifed elsewhere    Axis II: No diagnosis    Luis Bellows, LCSW 02/18/2018

## 2018-02-19 ENCOUNTER — Ambulatory Visit (HOSPITAL_COMMUNITY): Payer: Federal, State, Local not specified - PPO | Admitting: Psychiatry
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
## 2018-03-07 ENCOUNTER — Encounter (HOSPITAL_COMMUNITY): Payer: Self-pay | Admitting: Psychiatry

## 2018-03-07 ENCOUNTER — Ambulatory Visit (INDEPENDENT_AMBULATORY_CARE_PROVIDER_SITE_OTHER): Payer: Federal, State, Local not specified - PPO | Admitting: Psychiatry

## 2018-03-07 VITALS — BP 130/90 | HR 65 | Ht 71.0 in | Wt 279.0 lb

## 2018-03-07 DIAGNOSIS — F0630 Mood disorder due to known physiological condition, unspecified: Secondary | ICD-10-CM

## 2018-03-07 MED ORDER — CLONAZEPAM 0.5 MG PO TABS: 0.5000 mg | ORAL_TABLET | Freq: Three times a day (TID) | ORAL | 1 refills | Status: DC | PRN

## 2018-03-07 NOTE — Patient Instructions (Signed)
1. Discontinue olanzapine  2. Continueclonazepam 0.5 mg 3 times a day as needed for anxiety 3. Return to clinic in two months for 30 mins

## 2018-03-11 ENCOUNTER — Ambulatory Visit (INDEPENDENT_AMBULATORY_CARE_PROVIDER_SITE_OTHER): Payer: Federal, State, Local not specified - PPO | Admitting: Licensed Clinical Social Worker

## 2018-03-11 ENCOUNTER — Encounter (HOSPITAL_COMMUNITY): Payer: Self-pay | Admitting: Licensed Clinical Social Worker

## 2018-03-11 DIAGNOSIS — F0630 Mood disorder due to known physiological condition, unspecified: Secondary | ICD-10-CM

## 2018-03-11 NOTE — Progress Notes (Signed)
   THERAPIST PROGRESS NOTE  Session Time: 1:00 pm-1:45 pm  Participation Level: Active  Behavioral Response: CasualAlertAngry  Type of Therapy: Individual Therapy  Treatment Goals addressed: Coping  Interventions: CBT and Solution Focused  Summary: Luis Randall is a 58 y.o. male who presents with oriented x5 (person, place,situation, time, and object), casually dressed, appropriately groomed, average height, average, weight, and cooperative to address mood. Patient has a history of medical treatment including hypothyroidism, sleep apnea, and chronic fatigue.  Patient has a history of mental health treatment including outpatient therapy and medication management. Patient denies psychosis including auditory and visual hallucinations. Patient denies substance abuse. Patient is at low risk for lethality at time.  Physically: Patient is walking more. He continues to feel tired.  Spiritually/values: Patient knows what he needs to do spiritually but has not been doing it. Patient has been praying morning and night.  Relationships: Patient is getting along with his spouse. Patient recognizes that he wants a relationship with his children but then doesn't know what to say when he does talk to them.  Emotionally/Mentally/Behavior: Patient feels stuck. He is anxious which stops him from doing things. He experiences "analysis paralysis" and feels stuck. After discussion patient was able to identify that he has taken small steps to work on some things he has been avoiding.  Suicidal/Homicidal: Negativewithout intent/plan  Therapist Response: Therapist reviewed patient's recent thoughts and behaviors. Therapist utilized CBT to address mood. Therapist processed patient's feelings to identify triggers for mood. Therapist assisted patient with identifying ways that he has taken steps to work on things he has been stuck on.   Plan: Return again in 3 weeks.  Diagnosis: Axis I: Mood disorder in conditions  classifed elsewhere    Axis II: No diagnosis    Bynum Bellows, LCSW 03/11/2018

## 2018-03-18 ENCOUNTER — Ambulatory Visit: Payer: Self-pay | Admitting: Cardiovascular Disease

## 2018-04-08 ENCOUNTER — Ambulatory Visit (HOSPITAL_COMMUNITY): Payer: Federal, State, Local not specified - PPO | Admitting: Licensed Clinical Social Worker
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
## 2018-05-06 ENCOUNTER — Ambulatory Visit (HOSPITAL_COMMUNITY): Payer: Federal, State, Local not specified - PPO | Admitting: Licensed Clinical Social Worker

## 2018-05-07 ENCOUNTER — Ambulatory Visit (HOSPITAL_COMMUNITY): Payer: Federal, State, Local not specified - PPO | Admitting: Licensed Clinical Social Worker

## 2018-05-08 ENCOUNTER — Ambulatory Visit (INDEPENDENT_AMBULATORY_CARE_PROVIDER_SITE_OTHER): Payer: Federal, State, Local not specified - PPO | Admitting: Licensed Clinical Social Worker

## 2018-05-08 ENCOUNTER — Encounter (HOSPITAL_COMMUNITY): Payer: Self-pay | Admitting: Licensed Clinical Social Worker

## 2018-05-08 ENCOUNTER — Other Ambulatory Visit: Payer: Self-pay

## 2018-05-08 DIAGNOSIS — F0630 Mood disorder due to known physiological condition, unspecified: Secondary | ICD-10-CM | POA: Diagnosis not present

## 2018-05-08 NOTE — Progress Notes (Signed)
Virtual Visit via Telephone Note  I connected with Luis Randall on 05/08/18 at  1:00 PM EDT by telephone and verified that I am speaking with the correct person using two identifiers.   I discussed the limitations, risks, security and privacy concerns of performing an evaluation and management service by telephone and the availability of in person appointments. I also discussed with the patient that there may be a patient responsible charge related to this service. The patient expressed understanding and agreed to proceed.   History of Present Illness: Diagnosis: Axis I: Mood disorder in conditions classifed elsewhere  Luis Randall presents with oriented x5 (person, place,situation, time, and object), casually dressed, appropriately groomed, average height, average, weight, and cooperative to address mood. Patient has a history of medical treatment including hypothyroidism, sleep apnea, and chronic fatigue.  Patient has a history of mental health treatment including outpatient therapy and medication management. Patient denies psychosis including auditory and visual hallucinations. Patient denies substance abuse. Patient is at low risk for lethality at time.   Observations/Objective: Physically: Patient is staying active by working on a camp ground cleaning it up.  Spiritually/values: Patient continues to struggle spiritually. He prays for forgiveness but doesn't feel like he has been forgiven.  Relationships: Patient has an ok relationship with his wife. He gets frustrated with her easily.  Emotionally/Mentally/Behavior: Patient feels stuck. He has negative thoughts and views of himself. Patient can't forgive himself for things that he has done in the past. Patient focuses on his negative views and thoughts versus "reality."   Assessment and Plan: Therapist reviewed patient's recent thoughts and behaviors. Therapist utilized CBT to address mood. Therapist processed patient's feelings to identify  triggers for mood. Therapist assisted patient with identifying negative thoughts about himself and other.   Suicidal/Homicidal: Negativewithout intent/plan  Follow Up Instructions: Plan: Return again in 3 weeks.   I discussed the assessment and treatment plan with the patient. The patient was provided an opportunity to ask questions and all were answered. The patient agreed with the plan and demonstrated an understanding of the instructions.   The patient was advised to call back or seek an in-person evaluation if the symptoms worsen or if the condition fails to improve as anticipated.  I provided 40 minutes of non-face-to-face time during this encounter.   Bynum Bellows, LCSW

## 2018-05-09 ENCOUNTER — Encounter (HOSPITAL_COMMUNITY): Payer: Self-pay | Admitting: Psychiatry

## 2018-05-09 ENCOUNTER — Other Ambulatory Visit (HOSPITAL_COMMUNITY): Payer: Self-pay | Admitting: Psychiatry

## 2018-05-09 ENCOUNTER — Ambulatory Visit (INDEPENDENT_AMBULATORY_CARE_PROVIDER_SITE_OTHER): Payer: Federal, State, Local not specified - PPO | Admitting: Psychiatry

## 2018-05-09 ENCOUNTER — Telehealth (HOSPITAL_COMMUNITY): Payer: Self-pay | Admitting: *Deleted

## 2018-05-09 DIAGNOSIS — F0630 Mood disorder due to known physiological condition, unspecified: Secondary | ICD-10-CM | POA: Diagnosis not present

## 2018-05-09 MED ORDER — CLONAZEPAM 0.5 MG PO TABS: 0.5000 mg | ORAL_TABLET | Freq: Three times a day (TID) | ORAL | 0 refills | Status: DC | PRN

## 2018-05-09 MED ORDER — QUETIAPINE FUMARATE 25 MG PO TABS: 25.0000 mg | ORAL_TABLET | Freq: Every day | ORAL | 0 refills | Status: DC

## 2018-05-09 NOTE — Telephone Encounter (Signed)
ordered

## 2018-05-09 NOTE — Patient Instructions (Signed)
1.Start quetiapine 25 mg at night  2. Continueclonazepam 0.5 mg 3 times a day as needed for anxiety 3. 5/7 at 1:30 for 30 mins

## 2018-05-09 NOTE — Telephone Encounter (Signed)
Dr Vanetta Shawl Patient called back & stated that he & his wife spend the summer @ a camp ground in [place].  I called [facility] to have Rx cancel/dc'd. And know the scripts will need to be resent back to [facility] . I had called earlier to have them cancelled .

## 2018-05-09 NOTE — Telephone Encounter (Signed)
Patient's Mail Box is Full Unable to LVM

## 2018-05-09 NOTE — Telephone Encounter (Signed)
Dr Vanetta Shawl The medications sent to [facility]. Can't be filled due to patient has a [place] address & they don't fill prescriptions out of the state of [place]. Scripts will need to go to [facility]

## 2018-05-09 NOTE — Telephone Encounter (Signed)
Ordered. Contact [facility] to cancel orders. Please notify the patient of this change if he is not aware of it.

## 2018-05-09 NOTE — Addendum Note (Signed)
Addended by: Neysa Hotter on: 05/09/2018 04:32 PM   Modules accepted: Orders

## 2018-05-22 ENCOUNTER — Ambulatory Visit (HOSPITAL_COMMUNITY): Payer: Federal, State, Local not specified - PPO | Admitting: Licensed Clinical Social Worker
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
## 2018-06-05 ENCOUNTER — Other Ambulatory Visit: Payer: Self-pay

## 2018-06-05 ENCOUNTER — Ambulatory Visit (INDEPENDENT_AMBULATORY_CARE_PROVIDER_SITE_OTHER): Payer: Federal, State, Local not specified - PPO | Admitting: Licensed Clinical Social Worker

## 2018-06-05 ENCOUNTER — Encounter (HOSPITAL_COMMUNITY): Payer: Self-pay | Admitting: Licensed Clinical Social Worker

## 2018-06-05 DIAGNOSIS — F0630 Mood disorder due to known physiological condition, unspecified: Secondary | ICD-10-CM | POA: Diagnosis not present

## 2018-06-05 NOTE — Progress Notes (Signed)
Virtual Visit via Video Note  I connected with Luis Randall on 06/05/18 at  3:00 PM EDT by a video enabled telemedicine application and verified that I am speaking with the correct person using two identifiers.  Location: Patient: Home Provider: Office   I discussed the limitations of evaluation and management by telemedicine and the availability of in person appointments. The patient expressed understanding and agreed to proceed.  History of Present Illness: Diagnosis: Axis I: Mood disorder in conditions classifed elsewhere  Luis Randall presents with oriented x5 (person, place,situation, time, and object), casually dressed, appropriately groomed, average height, average, weight, and cooperative to address mood. Patient has a history of medical treatment including hypothyroidism, sleep apnea, and chronic fatigue.  Patient has a history of mental health treatment including outpatient therapy and medication management. Patient denies psychosis including auditory and visual hallucinations. Patient denies substance abuse. Patient is at low risk for lethality at time.   Observations/Objective: Physically: Patient is continuing to stay active. He is working almost daily outside. Patient has gone from barely taking 1,000 steps a day to almost 25-30 thousand steps a day.  Spiritually/values: Patient is reading the Bible. He is trying to focus on helping others rather than his own problems.  Relationships: Patient's relationship with his wife has improved. He is trying to keep his mouth shut and not talk back to her.  Emotionally/Mentally/Behavior: Patient mood has improved. He is feeling good. Patient is waking up early daily, staying active, helping others, and getting along with his spouse.   Assessment and Plan: Therapist reviewed patient's recent thoughts and behaviors. Therapist utilized CBT to address mood. Therapist processed patient's feelings to identify triggers for mood. Therapist assisted  patient in identifying ways that he has improved his mood and how the can maintain them.   Suicidal/Homicidal: Negativewithout intent/plan  Follow Up Instructions: Plan: Return again in 3 weeks.  I discussed the assessment and treatment plan with the patient. The patient was provided an opportunity to ask questions and all were answered. The patient agreed with the plan and demonstrated an understanding of the instructions.   The patient was advised to call back or seek an in-person evaluation if the symptoms worsen or if the condition fails to improve as anticipated.  I provided 30 minutes of non-face-to-face time during this encounter.  Luis Bellows, LCSW

## 2018-06-06 ENCOUNTER — Encounter (HOSPITAL_COMMUNITY): Payer: Self-pay | Admitting: Psychiatry

## 2018-06-06 ENCOUNTER — Ambulatory Visit (INDEPENDENT_AMBULATORY_CARE_PROVIDER_SITE_OTHER): Payer: Federal, State, Local not specified - PPO | Admitting: Psychiatry

## 2018-06-06 DIAGNOSIS — F0630 Mood disorder due to known physiological condition, unspecified: Secondary | ICD-10-CM

## 2018-06-06 MED ORDER — CLONAZEPAM 0.5 MG PO TABS: 0.5000 mg | ORAL_TABLET | Freq: Three times a day (TID) | ORAL | 0 refills | Status: DC | PRN
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
## 2018-07-03 ENCOUNTER — Encounter (HOSPITAL_COMMUNITY): Payer: Self-pay | Admitting: Licensed Clinical Social Worker

## 2018-07-03 ENCOUNTER — Other Ambulatory Visit: Payer: Self-pay

## 2018-07-03 ENCOUNTER — Ambulatory Visit (INDEPENDENT_AMBULATORY_CARE_PROVIDER_SITE_OTHER): Payer: Federal, State, Local not specified - PPO | Admitting: Licensed Clinical Social Worker

## 2018-07-03 DIAGNOSIS — F3132 Bipolar disorder, current episode depressed, moderate: Secondary | ICD-10-CM

## 2018-07-03 NOTE — Progress Notes (Signed)
Virtual Visit via Video Note  I connected with Luis Randall on 07/03/18 at 11:00 AM EDT by a video enabled telemedicine application and verified that I am speaking with the correct person using two identifiers.  Location: Patient: Home Provider: Office   I discussed the limitations of evaluation and management by telemedicine and the availability of in person appointments. The patient expressed understanding and agreed to proceed.  History of Present Illness: Diagnosis: Axis I: Mood disorder in conditions classifed elsewhere  Pranish presents with oriented x5 (person, place,situation, time, and object), casually dressed, appropriately groomed, average height, average, weight, and cooperative to address mood. Patient has a history of medical treatment including hypothyroidism, sleep apnea, and chronic fatigue.  Patient has a history of mental health treatment including outpatient therapy and medication management. Patient denies psychosis including auditory and visual hallucinations. Patient denies substance abuse. Patient is at low risk for lethality at time.   Observations/Objective: Physically: Patient is continuing to stay active. He has lost 25 lbs and wants to continue.  Spiritually/values: Patient is trying to be spiritually healthy.  Relationships: Patient had a blow up with his wife. He left the home for a few days and drove to [place] to see his daughter. He regrets running away. Patient is back home and things are going well.  Emotionally/Mentally/Behavior: Patient mood has improved. He is frustrated about how he got upset with his wife. Patient was able to identify thoughts that he had but gave into that caused him to leave the home. Patient said that it in future he is going to take a moment, remove himself from the situation, and pray. Patient identified another situation where he could have argued with others but recognized his thoughts and did not give into the impulse.     Assessment and Plan: Therapist reviewed patient's recent thoughts and behaviors. Therapist utilized CBT to address mood. Therapist processed patient's feelings to identify triggers for mood. Therapist discussed patient's anger, his thoughts of anger, and not giving in to the impulse of anger.   Suicidal/Homicidal: Negativewithout intent/plan  Follow Up Instructions: Plan: Return again in 3 weeks.  I discussed the assessment and treatment plan with the patient. The patient was provided an opportunity to ask questions and all were answered. The patient agreed with the plan and demonstrated an understanding of the instructions.   The patient was advised to call back or seek an in-person evaluation if the symptoms worsen or if the condition fails to improve as anticipated.  I provided 30 minutes of non-face-to-face time during this encounter.  Bynum Bellows, LCSW

## 2018-07-05 ENCOUNTER — Ambulatory Visit (HOSPITAL_COMMUNITY): Payer: Federal, State, Local not specified - PPO | Admitting: Psychiatry

## 2018-07-08 ENCOUNTER — Encounter (HOSPITAL_COMMUNITY): Payer: Self-pay | Admitting: Psychiatry

## 2018-07-08 ENCOUNTER — Ambulatory Visit (INDEPENDENT_AMBULATORY_CARE_PROVIDER_SITE_OTHER): Payer: Federal, State, Local not specified - PPO | Admitting: Psychiatry

## 2018-07-08 ENCOUNTER — Other Ambulatory Visit: Payer: Self-pay

## 2018-07-08 DIAGNOSIS — F3132 Bipolar disorder, current episode depressed, moderate: Secondary | ICD-10-CM

## 2018-07-08 MED ORDER — CLONAZEPAM 0.5 MG PO TABS: 0.5000 mg | ORAL_TABLET | Freq: Three times a day (TID) | ORAL | 0 refills | Status: DC | PRN

## 2018-07-08 NOTE — Patient Instructions (Signed)
1. Continueclonazepam 0.5 mg 3 times a day as needed for anxiety 2.Next appointment: 7.6 at 2 PM

## 2018-07-11 ENCOUNTER — Telehealth (HOSPITAL_COMMUNITY): Payer: Self-pay | Admitting: Psychiatry

## 2018-07-11 NOTE — Telephone Encounter (Signed)
Medication management - Attempted to reach patient to follow up on received papers from [facility] but could not leave a message as voicemail was full.  Will continue to reach patient to follow up on paperwork received.

## 2018-07-11 NOTE — Telephone Encounter (Signed)
Could you contact the patient. We received from from [place] group for disability benefits. I do not recall any conversation about this form, and I believe he is applying to disability through [place]. Will not plan to send the from at this time. Let us know if he has any concerns.

## 2018-07-11 NOTE — Telephone Encounter (Signed)
Patient called back & I asked if I informed him that his M.B. is full. He said yes I did tell him previously. But he states he is receiving Social Security benefits from the state  Of [place]. And that his short term disability has now changed into long term disability. And that he is fine with  filling out forms & office notes sent. I told him he may receive a call from Dr Modesta Messing to discuss this in further detail if needed.

## 2018-07-15 NOTE — Telephone Encounter (Signed)
Please notify him that will plan to work on this form after the next visit to accurately fill in the form.
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
## 2018-08-01 ENCOUNTER — Other Ambulatory Visit (HOSPITAL_COMMUNITY): Payer: Self-pay | Admitting: Psychiatry

## 2018-08-01 ENCOUNTER — Telehealth (HOSPITAL_COMMUNITY): Payer: Self-pay | Admitting: *Deleted

## 2018-08-01 MED ORDER — CLONAZEPAM 0.5 MG PO TABS: 0.5000 mg | ORAL_TABLET | Freq: Three times a day (TID) | ORAL | 0 refills | Status: DC | PRN

## 2018-08-01 NOTE — Telephone Encounter (Signed)
Appt changed to 08/29/2018

## 2018-08-01 NOTE — Telephone Encounter (Signed)
DR HISADA PATIENT CALLED REQUESTING REFILL ON MEDICATION Rx INFORMATION HAS BEEN UPDATED IN SYSTEM

## 2018-08-01 NOTE — Telephone Encounter (Signed)
He will be able to fill medication after 7/7. Please also make sure that he has follow up. I will not be able to do any more refills without evaluation.

## 2018-08-05 ENCOUNTER — Ambulatory Visit (HOSPITAL_COMMUNITY): Payer: Federal, State, Local not specified - PPO | Admitting: Psychiatry

## 2018-08-15 ENCOUNTER — Telehealth (HOSPITAL_COMMUNITY): Payer: Self-pay | Admitting: Licensed Clinical Social Worker

## 2018-08-15 ENCOUNTER — Ambulatory Visit (HOSPITAL_COMMUNITY): Payer: Federal, State, Local not specified - PPO | Admitting: Licensed Clinical Social Worker

## 2018-08-15 ENCOUNTER — Other Ambulatory Visit: Payer: Self-pay

## 2018-08-15 NOTE — Telephone Encounter (Signed)
Patient did not respond to two attempts for video session.

## 2018-08-21 ENCOUNTER — Other Ambulatory Visit: Payer: Self-pay

## 2018-08-21 ENCOUNTER — Ambulatory Visit (INDEPENDENT_AMBULATORY_CARE_PROVIDER_SITE_OTHER): Payer: Federal, State, Local not specified - PPO | Admitting: Licensed Clinical Social Worker

## 2018-08-21 ENCOUNTER — Encounter (HOSPITAL_COMMUNITY): Payer: Self-pay | Admitting: Licensed Clinical Social Worker

## 2018-08-21 DIAGNOSIS — F3132 Bipolar disorder, current episode depressed, moderate: Secondary | ICD-10-CM | POA: Diagnosis not present
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
## 2018-08-21 NOTE — Progress Notes (Signed)
Virtual Visit via Video Note  I connected with Luis Randall on 08/21/18 at  9:00 AM EDT by a video enabled telemedicine application and verified that I am speaking with the correct person using two identifiers.  Location: Patient: Home Provider: Office   I discussed the limitations of evaluation and management by telemedicine and the availability of in person appointments. The patient expressed understanding and agreed to proceed.  History of Present Illness: Diagnosis: Axis I: Mood disorder in conditions classifed elsewhere  Luis Randall presents with oriented x5 (person, place,situation, time, and object), casually dressed, appropriately groomed, average height, average, weight, and cooperative to address mood. Patient has a history of medical treatment including hypothyroidism, sleep apnea, and chronic fatigue.  Patient has a history of mental health treatment including outpatient therapy and medication management. Patient denies psychosis including auditory and visual hallucinations. Patient denies substance abuse. Patient is at low risk for lethality at time.   Observations/Objective: Physically: Patient is continuing to stay active by working around the [employer]. He is stress eating as well.  Spiritually/values: Patient is trying to be spiritually healthy.  Relationships: Patient is not getting along with his wife. They have been arguing. She is critical of him and he blows up at her. Patient tries to walk away when they argue but she criticizes him for "running away" from problems. Patient agreed to try to do "acts of service" for his wife to improve things between them. Patient says that his "love language" is words of affirmation. He feels like he doesn't get them from his wife. He feels like they have lost respect for each other.  Emotionally/Mentally/Behavior: Patient mood is depressed. He feels like he is in his "cycle of depression" and will get out of the cycle. Patient recognizes  that his thoughts are his worst enemy and get his "stuck" in negative thinking.     Assessment and Plan: Therapist reviewed patient's recent thoughts and behaviors. Therapist utilized CBT to address mood. Therapist processed patient's feelings to identify triggers for mood. Therapist discussed patient's thoughts and his relationship with his wife.   Suicidal/Homicidal: Negativewithout intent/plan  Follow Up Instructions: Plan: Return again in 3 weeks.  I discussed the assessment and treatment plan with the patient. The patient was provided an opportunity to ask questions and all were answered. The patient agreed with the plan and demonstrated an understanding of the instructions.   The patient was advised to call back or seek an in-person evaluation if the symptoms worsen or if the condition fails to improve as anticipated.  I provided 30 minutes of non-face-to-face time during this encounter.  Glori Bickers, LCSW

## 2018-08-29 ENCOUNTER — Other Ambulatory Visit: Payer: Self-pay

## 2018-08-29 ENCOUNTER — Encounter (HOSPITAL_COMMUNITY): Payer: Self-pay | Admitting: Psychiatry

## 2018-08-29 ENCOUNTER — Ambulatory Visit (INDEPENDENT_AMBULATORY_CARE_PROVIDER_SITE_OTHER): Payer: Federal, State, Local not specified - PPO | Admitting: Psychiatry

## 2018-08-29 DIAGNOSIS — G4700 Insomnia, unspecified: Secondary | ICD-10-CM

## 2018-08-29 DIAGNOSIS — F3132 Bipolar disorder, current episode depressed, moderate: Secondary | ICD-10-CM

## 2018-08-29 NOTE — Patient Instructions (Signed)
1. Continueclonazepam 0.5 mg 3 times a day as needed for anxiety 2. Please contact our office if you are interested in starting risperidone 3.Next appointment: 9/22 at 1:20

## 2018-10-15 ENCOUNTER — Encounter (HOSPITAL_COMMUNITY): Payer: Self-pay | Admitting: Licensed Clinical Social Worker

## 2018-10-15 ENCOUNTER — Other Ambulatory Visit: Payer: Self-pay

## 2018-10-15 ENCOUNTER — Ambulatory Visit (INDEPENDENT_AMBULATORY_CARE_PROVIDER_SITE_OTHER): Payer: Federal, State, Local not specified - PPO | Admitting: Licensed Clinical Social Worker

## 2018-10-15 DIAGNOSIS — F3132 Bipolar disorder, current episode depressed, moderate: Secondary | ICD-10-CM | POA: Diagnosis not present

## 2018-10-15 NOTE — Progress Notes (Signed)
Virtual Visit via Video Note  I connected with Luis Randall on 10/15/18 at 10:00 AM EDT by a video enabled telemedicine application and verified that I am speaking with the correct person using two identifiers.  Location: Patient: Home Provider: Office   I discussed the limitations of evaluation and management by telemedicine and the availability of in person appointments. The patient expressed understanding and agreed to proceed.  History of Present Illness: Diagnosis: Axis I: Mood disorder in conditions classifed elsewhere  Tel presents with oriented x5 (person, place,situation, time, and object), casually dressed, appropriately groomed, average height, average, weight, and cooperative to address mood. Patient has a history of medical treatment including hypothyroidism, sleep apnea, and chronic fatigue.  Patient has a history of mental health treatment including outpatient therapy and medication management. Patient denies psychosis including auditory and visual hallucinations. Patient denies substance abuse. Patient is at low risk for lethality at time.   Observations/Objective: Physically: Patient has been active with working at the [place] on a regular basis. Patient noted that his weight has fluctuated. He stress eats things such as doughnuts and drinking soda when he is depressed or anxious.  Spiritually/values: No issues reported.  Relationships: Patient has a strained relationship with his wife. He loves his wife but feels like at times he feels like she takes advantage of him. Patient feels like she conveniently forgets her money when she needs cigarettes or wants to buy new shoes. She works at the [employer] but patient only gets a Surveyor, quantity pension. Patient feels like they are not on the same page with money, with where to live, etc. Patient also feels like he has ignored red flags from her and it is coming back to haunt him. Patient feels like she has lied about having a  pension, about having insurance, and about her work history.  Emotionally/Mentally/Behavior: Patient mood is down and irritable. Patient feels like he can go a few weeks with being in a good mood and then be in a bad mood that lasts several days. Patient recognizes this pattern but is not sure of how to break it. He is not aware of any situations or thoughts that trigger it. Patient agreed to recognize his thoughts and "catch" his negative thinking.    Assessment and Plan: Therapist reviewed patient's recent thoughts and behaviors. Therapist utilized CBT to address mood. Therapist processed patient's feelings to identify triggers for mood. Therapist discussed patient's thoughts and his relationship with his wife and his pattern of mood. Therapist and patient updated patient's treatment plan.   Suicidal/Homicidal: Negativewithout intent/plan  Follow Up Instructions: Plan: Return again in 3 weeks.  I discussed the assessment and treatment plan with the patient. The patient was provided an opportunity to ask questions and all were answered. The patient agreed with the plan and demonstrated an understanding of the instructions.   The patient was advised to call back or seek an in-person evaluation if the symptoms worsen or if the condition fails to improve as anticipated.  I provided 50 minutes of non-face-to-face time during this encounter.  Glori Bickers, LCSW
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
## 2018-10-22 ENCOUNTER — Encounter (HOSPITAL_COMMUNITY): Payer: Self-pay | Admitting: Psychiatry

## 2018-10-22 ENCOUNTER — Ambulatory Visit (INDEPENDENT_AMBULATORY_CARE_PROVIDER_SITE_OTHER): Payer: Federal, State, Local not specified - PPO | Admitting: Psychiatry

## 2018-10-22 ENCOUNTER — Other Ambulatory Visit: Payer: Self-pay

## 2018-10-22 DIAGNOSIS — F3132 Bipolar disorder, current episode depressed, moderate: Secondary | ICD-10-CM

## 2018-10-22 MED ORDER — CARBAMAZEPINE ER 100 MG PO CP12: 100.0000 mg | ORAL_CAPSULE | Freq: Two times a day (BID) | ORAL | 1 refills | Status: DC

## 2018-10-22 NOTE — Patient Instructions (Signed)
1. Start carbamazepine 100 mg twice a day  2. Continueclonazepam 0.5 mg 3 times a day as needed for anxiety  3. Next appointment: 10/26 at 3 PM
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
## 2018-11-25 ENCOUNTER — Other Ambulatory Visit: Payer: Self-pay

## 2018-11-25 ENCOUNTER — Ambulatory Visit (HOSPITAL_COMMUNITY): Payer: Federal, State, Local not specified - PPO | Admitting: Psychiatry

## 2018-11-25 ENCOUNTER — Telehealth (HOSPITAL_COMMUNITY): Payer: Self-pay | Admitting: Psychiatry

## 2018-11-25 NOTE — Telephone Encounter (Signed)
Sent link for video visit through [employer] [facility]. Patient did not sign in. Called the patient  twice for appointment scheduled today. The patient did not answer the phone. No option to leave a voice message. 

## 2018-11-27 ENCOUNTER — Ambulatory Visit: Payer: Federal, State, Local not specified - PPO | Admitting: Orthopedic Surgery
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
## 2018-12-06 ENCOUNTER — Encounter (HOSPITAL_COMMUNITY): Payer: Self-pay | Admitting: Psychiatry

## 2018-12-06 ENCOUNTER — Other Ambulatory Visit: Payer: Self-pay

## 2018-12-06 ENCOUNTER — Ambulatory Visit (INDEPENDENT_AMBULATORY_CARE_PROVIDER_SITE_OTHER): Payer: Federal, State, Local not specified - PPO | Admitting: Psychiatry

## 2018-12-06 DIAGNOSIS — F0630 Mood disorder due to known physiological condition, unspecified: Secondary | ICD-10-CM

## 2018-12-06 MED ORDER — CLONAZEPAM 0.5 MG PO TABS: 0.5000 mg | ORAL_TABLET | Freq: Three times a day (TID) | ORAL | 2 refills | Status: DC | PRN

## 2018-12-17 ENCOUNTER — Other Ambulatory Visit: Payer: Self-pay

## 2018-12-17 ENCOUNTER — Encounter (HOSPITAL_COMMUNITY): Payer: Self-pay | Admitting: Licensed Clinical Social Worker

## 2018-12-17 ENCOUNTER — Ambulatory Visit (INDEPENDENT_AMBULATORY_CARE_PROVIDER_SITE_OTHER): Payer: Federal, State, Local not specified - PPO | Admitting: Licensed Clinical Social Worker

## 2018-12-17 DIAGNOSIS — F3132 Bipolar disorder, current episode depressed, moderate: Secondary | ICD-10-CM | POA: Diagnosis not present

## 2018-12-17 NOTE — Progress Notes (Signed)
Virtual Visit via Video Note  I connected with Luis Randall on 12/17/18 at 10:00 AM EST by a video enabled telemedicine application and verified that I am speaking with the correct person using two identifiers.  Location: Patient: Home Provider: Office   I discussed the limitations of evaluation and management by telemedicine and the availability of in person appointments. The patient expressed understanding and agreed to proceed.  History of Present Illness: Diagnosis: Axis I: Mood disorder in conditions classifed elsewhere  Luis Randall presented with oriented x5 (person, place,situation, time, and object), casually dressed, appropriately groomed, average height, average, weight, and cooperative to address mood. Patient has a history of medical treatment including hypothyroidism, sleep apnea, and chronic fatigue.  Patient has a history of mental health treatment including outpatient therapy and medication management. Patient denies psychosis including auditory and visual hallucinations. Patient denies substance abuse. Patient is at low risk for lethality at this time.   Observations/Objective: Physically: Patient has been gaining weight.  Spiritually/values: Patient continues to "talk to God."  Relationships: Patient has a strained relationship with his wife. Patient is feeling detached from his wife and children. He has felt this way before in another marriage and got through it with talking to God. Patient knows where this feeling can lead and wants to prevent getting further detached and ending the marriage.  Emotionally/Mentally/Behavior: Patient mood is down and irritable. Patient shared that he got upset during his meeting with Dr. Modesta Messing because his wife was trying to talk to him while he was talking to her. He gets irritable when situations like this happen because he can't process/focus all that is being said. Patient understood that he needs some physical distance between him and his  spouse during the day or in other words he needs to avoid sitting around the camper all day. 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   Assessment and Plan: Therapist reviewed patient's recent thoughts and behaviors. Therapist utilized CBT to address mood. Therapist processed patient's feelings to identify triggers for mood. Therapist discussed patient's detachment and his relationships.   Suicidal/Homicidal: Negativewithout intent/plan  Follow Up Instructions: Plan: Return again in 4 weeks.  I discussed the assessment and treatment plan with the patient. The patient was provided an opportunity to ask questions and all were answered. The patient agreed with the plan and demonstrated an understanding of the instructions.   The patient was advised to call back or seek an in-person evaluation if the symptoms worsen or if the condition fails to improve as anticipated.  I provided 50 minutes of non-face-to-face time during this encounter.  Luis Bickers, LCSW

## 2019-01-06 ENCOUNTER — Other Ambulatory Visit (HOSPITAL_COMMUNITY): Payer: Self-pay | Admitting: Psychiatry

## 2019-01-06 MED ORDER — CARBAMAZEPINE ER 100 MG PO CP12: 100.0000 mg | ORAL_CAPSULE | Freq: Two times a day (BID) | ORAL | 1 refills | Status: DC

## 2019-01-22 ENCOUNTER — Ambulatory Visit (HOSPITAL_COMMUNITY): Payer: Federal, State, Local not specified - PPO | Admitting: Licensed Clinical Social Worker

## 2019-01-23 ENCOUNTER — Telehealth: Payer: Self-pay | Admitting: Cardiovascular Disease

## 2019-01-23 NOTE — Telephone Encounter (Signed)
Called to schedule pt for his apt w/ Dr. Johnsie Cancel and was told that he was no longer in [place] and that was fine.

## 2019-02-06 ENCOUNTER — Ambulatory Visit (HOSPITAL_COMMUNITY): Payer: Federal, State, Local not specified - PPO | Admitting: Psychiatry

## 2019-02-11 ENCOUNTER — Ambulatory Visit (HOSPITAL_COMMUNITY): Payer: Federal, State, Local not specified - PPO | Admitting: Licensed Clinical Social Worker

## 2019-02-12 ENCOUNTER — Telehealth (HOSPITAL_COMMUNITY): Payer: Self-pay | Admitting: Licensed Clinical Social Worker

## 2019-02-12 NOTE — Telephone Encounter (Signed)
I sent a text to connect for video session at 4pm and then again at 4:10 pm. No response.

## 2019-02-18 ENCOUNTER — Other Ambulatory Visit: Payer: Self-pay

## 2019-02-18 ENCOUNTER — Encounter: Payer: Self-pay | Admitting: Psychiatry

## 2019-02-18 ENCOUNTER — Ambulatory Visit (INDEPENDENT_AMBULATORY_CARE_PROVIDER_SITE_OTHER): Payer: Federal, State, Local not specified - PPO | Admitting: Psychiatry

## 2019-02-18 DIAGNOSIS — F0630 Mood disorder due to known physiological condition, unspecified: Secondary | ICD-10-CM | POA: Diagnosis not present

## 2019-02-18 MED ORDER — CLONAZEPAM 0.5 MG PO TABS: 0.5000 mg | ORAL_TABLET | Freq: Four times a day (QID) | ORAL | 1 refills | Status: DC | PRN

## 2019-02-18 NOTE — Progress Notes (Signed)
BH MD OP Progress Note  I connected with  Luis Randall on 02/18/19 by a video enabled telemedicine application and verified that I am speaking with the correct person using two identifiers.   I discussed the limitations of evaluation and management by telemedicine. The patient expressed understanding and agreed to proceed.    02/18/2019 10:54 AM Luis Randall  MRN:  [id number]  Chief Complaint:  " I have been on same dose of klonopin for a while, I think the dose needs to be increased."  HPI: Patient stated that he believes the dose of Klonopin is to be increased as current dose does not help all the time.  He stated that he has tried several different medications and none of the medications have helped him much.  He stated that he still feels irritable and sometimes can go off from people.  He stated that he has sometimes taken up to 5 tablets of Klonopin instead of 3 in a day and the increased dose does seem to help by taking the edge off.  He stated that after trying so many different medications he does not think the medications are made for him and the only that helps him is Klonopin. He also stated that he feels talk therapy is more helpful than the medications for him. I have reviewed the PDMP during this encounter. Patient denied any excessive consumption of alcohol or illicit use of drugs.  He stated he has not touched alcohol in like 20 years.   He still has a stable job.  As per EMR, Past trials of medication:sertraline, fluoxetine, Paxil,bupropion,lamotrigine (headache),carbamazepine (black out), Depakote,Buspar,Trintellix, Abilify, vraylar(worked well, but hecould not afford),risperidone,olanzapine (increased anxiety),quetiapine (hang over).  He no-showed for his last therapy appointment on 02/11/19 and has another appt for therapy scheduled tomorrow.  Visit Diagnosis:    ICD-10-CM   1. Mood disorder in conditions classified elsewhere  F06.30     Past  Psychiatric History: Mood disorder, unspecified  Past Medical History:  Past Medical History:  Diagnosis Date  . Anxiety   . Chest pain    a. 07/2017: cath showing no angiographically significant CAD with normal LV function and mild to moderate MR.  . Depression   . Sleep apnea   . Thyroid disease     Past Surgical History:  Procedure Laterality Date  . APPENDECTOMY  2006  . ELBOW SURGERY Right 2010   release  . ENDOSCOPIC VEIN LASER TREATMENT Bilateral 2005  . LEFT HEART CATH AND CORONARY ANGIOGRAPHY N/A 08/06/2017   Procedure: LEFT HEART CATH AND CORONARY ANGIOGRAPHY;  Surgeon: Yvonne Kendall, MD;  Location: [facility];  Service: Cardiovascular;  Laterality: N/A;  . SHOULDER SURGERY Right 2011  . VARICOSE VEIN SURGERY Bilateral 1995    Family Psychiatric History: see below  Family History:  Family History  Problem Relation Age of Onset  . Arthritis Mother   . Varicose Veins Mother   . Alcohol abuse Father   . Tuberculosis Father   . Colon cancer Paternal Grandfather        Maybe...versus pancreatic cancer    Social History:  Social History   Socioeconomic History  . Marital status: Legally Separated    Spouse name: Not on file  . Number of children: Not on file  . Years of education: Not on file  . Highest education level: Not on file  Occupational History  . Not on file  Tobacco Use  . Smoking status: Never Smoker  . Smokeless  tobacco: Never Used  Substance and Sexual Activity  . Alcohol use: No  . Drug use: No  . Sexual activity: Yes  Other Topics Concern  . Not on file  Social History Narrative  . Not on file   Social Determinants of Health   Financial Resource Strain:   . Difficulty of Paying Living Expenses: Not on file  Food Insecurity:   . Worried About Programme researcher, broadcasting/film/video in the Last Year: Not on file  . Ran Out of Food in the Last Year: Not on file  Transportation Needs:   . Lack of Transportation (Medical): Not on file  . Lack  of Transportation (Non-Medical): Not on file  Physical Activity:   . Days of Exercise per Week: Not on file  . Minutes of Exercise per Session: Not on file  Stress:   . Feeling of Stress : Not on file  Social Connections:   . Frequency of Communication with Friends and Family: Not on file  . Frequency of Social Gatherings with Friends and Family: Not on file  . Attends Religious Services: Not on file  . Active Member of Clubs or Organizations: Not on file  . Attends Banker Meetings: Not on file  . Marital Status: Not on file    Allergies: No Known Allergies  Metabolic Disorder Labs: Lab Results  Component Value Date   HGBA1C 5.6 07/13/2016   No results found for: PROLACTIN Lab Results  Component Value Date   CHOL 146 04/06/2016   TRIG 107 04/06/2016   HDL 36 (L) 04/06/2016   CHOLHDL 4.1 04/06/2016   VLDL 21 04/06/2016   LDLCALC 89 04/06/2016   Lab Results  Component Value Date   TSH 1.33 01/11/2017   TSH 7.31 (H) 09/14/2016    Therapeutic Level Labs: No results found for: LITHIUM No results found for: VALPROATE No components found for:  CBMZ  Current Medications: Current Outpatient Medications  Medication Sig Dispense Refill  . ARMOUR THYROID 120 MG tablet TAKE (1) TABLET BY MOUTH ONCE DAILY. 30 tablet 0  . famotidine (PEPCID) 20 MG tablet Take 1 tablet (20 mg total) by mouth 2 (two) times daily.    . metoprolol tartrate (LOPRESSOR) 25 MG tablet Take 0.5 tablets (12.5 mg total) by mouth 2 (two) times daily. 45 tablet 6  . nitroGLYCERIN (NITROSTAT) 0.4 MG SL tablet Place 1 tablet (0.4 mg total) under the tongue every 5 (five) minutes as needed. (Patient taking differently: Place 0.4 mg under the tongue every 5 (five) minutes as needed for chest pain. ) 25 tablet 3  . tiZANidine (ZANAFLEX) 4 MG tablet Take 1 tablet (4 mg total) by mouth every 8 (eight) hours as needed for muscle spasms. 60 tablet 2   No current facility-administered medications for this  visit.      Psychiatric Specialty Exam: Review of Systems  There were no vitals taken for this visit.There is no height or weight on file to calculate BMI.  General Appearance: Well Groomed, wearing work uniform  Eye Contact:  Good  Speech:  Clear and Coherent and Normal Rate  Volume:  Normal  Mood:  Euthymic  Affect:  Congruent  Thought Process:  Goal Directed, Linear and Descriptions of Associations: Intact  Orientation:  Full (Time, Place, and Person)  Thought Content: Logical and Focused on higher dose of clonazepam   Suicidal Thoughts:  No  Homicidal Thoughts:  No  Memory:  Immediate;   Good Recent;   Good  Judgement:  Fair  Insight:  Fair  Psychomotor Activity:  Normal  Concentration:  Concentration: Good and Attention Span: Good  Recall:  Good  Fund of Knowledge: Good  Language: Good  Akathisia:  Yes  Handed:  Right  AIMS (if indicated): not done  Assets:  Communication Skills Desire for Improvement Financial Resources/Insurance Housing  ADL's:  Intact  Cognition: WNL  Sleep:  Fair   Screenings: PHQ2-9     Office Visit from 01/11/2017 in [place] Office Visit from 09/14/2016 in [place] Office Visit from 04/06/2016 in [place]  PHQ-2 Total Score  0  0  0  PHQ-9 Total Score  --  0  0       Assessment and Plan: Patient requested for increasing his clonazepam dose as he feels that he needs a higher dose to help with his mood and irritability.  He has tried several different medications in the past and believes none of them helped and would like to continue talk therapy.  1. Mood disorder in conditions classified elsewhere  - Increase clonazePAM (KLONOPIN) 0.5 MG tablet; Take 1 tablet (0.5 mg total) by mouth 4 (four) times daily as needed for anxiety.  Dispense: 120 tablet; Refill: 1  Continue individual therapy. F/up in 2 months with Dr. Modesta Messing.  [place] Crane, MD 02/18/2019, 10:54 AM

## 2019-02-19 ENCOUNTER — Ambulatory Visit (INDEPENDENT_AMBULATORY_CARE_PROVIDER_SITE_OTHER): Payer: Federal, State, Local not specified - PPO | Admitting: Clinical

## 2019-02-19 DIAGNOSIS — F319 Bipolar disorder, unspecified: Secondary | ICD-10-CM
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
## 2019-04-08 ENCOUNTER — Ambulatory Visit (INDEPENDENT_AMBULATORY_CARE_PROVIDER_SITE_OTHER): Payer: Federal, State, Local not specified - PPO | Admitting: Clinical

## 2019-04-08 ENCOUNTER — Other Ambulatory Visit: Payer: Self-pay

## 2019-04-08 DIAGNOSIS — F319 Bipolar disorder, unspecified: Secondary | ICD-10-CM | POA: Diagnosis not present

## 2019-04-08 NOTE — Progress Notes (Signed)
Virtual Visit via Telephone Note  I connected with Kelton Pillar on 04/08/19 at  9:00 AM EST by telephone and verified that I am speaking with the correct person using two identifiers.  Location: Patient: Home Provider: Office    I discussed the limitations, risks, security and privacy concerns of performing an evaluation and management service by telephone and the availability of in person appointments. I also discussed with the patient that there may be a patient responsible charge related to this service. The patient expressed understanding and agreed to proceed.     THERAPIST PROGRESS NOTE  Session Time: 9:00AM-9:45AM  Participation Level: Active  Behavioral Response: CasualAlertDepressed  Type of Therapy: Individual Therapy  Treatment Goals addressed: Coping and Communication  Interventions: CBT  Summary: ONYX SCHIRMER is a 59 y.o. male who presents with Bi-Polar Disorder.The [facility] therapist worked with the patient for his initial session. The OPT therapist utilized Motivational Interviewing to assist in creating therapeutic repore. The patient in the session was engaged and work in collaboration giving feedback about his triggers and symptoms over the past few weeks including difficulty within his marriage and fear of Separation/Divorce. The OPT therapist utilized Cognitive Behavioral Therapy through cognitive restructuring as well as worked with the patient on coping strategies to assist in management of mood. The OPT therapist inquired for holistic care about the patients adherence to medication therapy.  Suicidal/Homicidal: Nowithout intent/plan  Therapist Response: The OPT therapist worked with the patient for the patients initial scheduled session. The patient was engaged in his session and gave feedback in relation to triggers, symptoms, and behavior responses over the past few weeks. The OPT therapist worked with the patient utilizing an in session Cognitive Behavioral  Therapy exercise. The patient was responsive in the session and verbalized, " I am working on my relationship with God and my faith and I have realized the person who I have been and what I need to change in my life". The OPT therapist supported the patients increased involvement with his church as a protective factor. The OPT therapist will continue treatment work with the patient in his next scheduled session.  Plan: Return again in 2 weeks.  Diagnosis: Axis I: Bipolar I disorder with anxious distress    Axis II: No diagnosis  I discussed the assessment and treatment plan with the patient. The patient was provided an opportunity to ask questions and all were answered. The patient agreed with the plan and demonstrated an understanding of the instructions.   The patient was advised to call back or seek an in-person evaluation if the symptoms worsen or if the condition fails to improve as anticipated.  I provided 45 minutes of non-face-to-face time during this encounter.  [name], LCSW 04/08/2019
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
## 2019-04-18 ENCOUNTER — Ambulatory Visit (INDEPENDENT_AMBULATORY_CARE_PROVIDER_SITE_OTHER): Payer: Federal, State, Local not specified - PPO | Admitting: Psychiatry

## 2019-04-18 ENCOUNTER — Encounter (HOSPITAL_COMMUNITY): Payer: Self-pay | Admitting: Psychiatry

## 2019-04-18 ENCOUNTER — Other Ambulatory Visit: Payer: Self-pay

## 2019-04-18 DIAGNOSIS — F0630 Mood disorder due to known physiological condition, unspecified: Secondary | ICD-10-CM | POA: Diagnosis not present

## 2019-04-18 MED ORDER — CLONAZEPAM 0.5 MG PO TABS: 0.5000 mg | ORAL_TABLET | Freq: Four times a day (QID) | ORAL | 1 refills | Status: DC | PRN

## 2019-04-18 MED ORDER — ZIPRASIDONE HCL 20 MG PO CAPS: 20.0000 mg | ORAL_CAPSULE | Freq: Two times a day (BID) | ORAL | 1 refills | Status: DC

## 2019-04-18 NOTE — Patient Instructions (Signed)
1. Start Geodon 20 mg twice a day with meals 2. Continueclonazepam 0.5 mg 4 times a day as needed for anxiety 3.Next appointment:5/7 at 9:10

## 2019-04-30 ENCOUNTER — Telehealth (HOSPITAL_COMMUNITY): Payer: Self-pay | Admitting: Clinical

## 2019-04-30 ENCOUNTER — Ambulatory Visit (HOSPITAL_COMMUNITY): Payer: Federal, State, Local not specified - PPO | Admitting: Clinical

## 2019-04-30 ENCOUNTER — Other Ambulatory Visit: Payer: Self-pay

## 2019-04-30 NOTE — Telephone Encounter (Signed)
the OPT therapist called and left a VM giving the patient a window to recall to continue with the appointment. The patient did not call within the time frame to keep the appointment and missed this scheduled appointment.
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
## 2019-06-06 ENCOUNTER — Other Ambulatory Visit: Payer: Self-pay

## 2019-06-06 ENCOUNTER — Telehealth (HOSPITAL_COMMUNITY): Payer: Federal, State, Local not specified - PPO | Admitting: Psychiatry

## 2019-06-06 ENCOUNTER — Telehealth (HOSPITAL_COMMUNITY): Payer: Self-pay | Admitting: Psychiatry

## 2019-06-06 NOTE — Telephone Encounter (Signed)
Contacted the patient twice for appointment this morning. He did not answer the phone call. Left the voice message to contact the office.

## 2019-06-17 ENCOUNTER — Telehealth (HOSPITAL_COMMUNITY): Payer: Self-pay | Admitting: Psychiatry

## 2019-06-17 MED ORDER — ZIPRASIDONE HCL 20 MG PO CAPS: 20.0000 mg | ORAL_CAPSULE | Freq: Two times a day (BID) | ORAL | 0 refills | Status: DC

## 2019-06-17 NOTE — Telephone Encounter (Signed)
Ordered refill of Geodon per request. Please contact the patient to make follow up appointment. I will not be able to prescribe any more refills without evaluation.

## 2019-06-18 NOTE — Telephone Encounter (Signed)
Attempted Call NO ANSWER .Marland Kitchen Trying to Inform per Provider ::: Ordered refill of Geodon per request. Please contact the patient to make follow up appointment. I will not be able to prescribe any more refills without evaluation.

## 2019-06-19 ENCOUNTER — Other Ambulatory Visit (HOSPITAL_COMMUNITY): Payer: Self-pay | Admitting: Psychiatry

## 2019-06-19 DIAGNOSIS — F0630 Mood disorder due to known physiological condition, unspecified: Secondary | ICD-10-CM

## 2019-06-19 MED ORDER — CLONAZEPAM 0.5 MG PO TABS: 0.5000 mg | ORAL_TABLET | Freq: Four times a day (QID) | ORAL | 0 refills | Status: DC | PRN

## 2019-07-18 ENCOUNTER — Other Ambulatory Visit (HOSPITAL_COMMUNITY): Payer: Self-pay | Admitting: Psychiatry

## 2019-07-18 DIAGNOSIS — F0630 Mood disorder due to known physiological condition, unspecified: Secondary | ICD-10-CM

## 2019-08-01 ENCOUNTER — Encounter: Payer: Self-pay | Admitting: Orthopedic Surgery

## 2019-08-01 ENCOUNTER — Other Ambulatory Visit: Payer: Self-pay

## 2019-08-01 ENCOUNTER — Ambulatory Visit: Payer: Federal, State, Local not specified - PPO | Admitting: Orthopedic Surgery

## 2019-08-01 VITALS — BP 141/89 | HR 70 | Ht 71.0 in | Wt 250.0 lb

## 2019-08-01 DIAGNOSIS — M1710 Unilateral primary osteoarthritis, unspecified knee: Secondary | ICD-10-CM

## 2019-08-01 DIAGNOSIS — M1712 Unilateral primary osteoarthritis, left knee: Secondary | ICD-10-CM

## 2019-08-01 MED ORDER — DICLOFENAC SODIUM 1 % EX GEL
4.0000 g | Freq: Four times a day (QID) | CUTANEOUS | 5 refills | Status: DC
Start: 2019-08-01 — End: 2021-09-01

## 2019-08-01 NOTE — Progress Notes (Signed)
Chief Complaint  Patient presents with  . Knee Pain    has swelling at times/ 2 weeks ago was very swolllen     19 male h/o OA left knee   Has had a few episodes of left knee pain and swelling. He started voltaren gel and it seemed to get better   ROS: dull aching pain left knee   BP (!) 141/89   Pulse 70   Ht 5\' 11"  (1.803 m)   Wt 250 lb (113.4 kg)   BMI 34.87 kg/m   Physical Exam Constitutional:      Appearance: He is normal weight.  Musculoskeletal:       Legs:  Skin:    General: Skin is warm.     Capillary Refill: Capillary refill takes less than 2 seconds.  Neurological:     Mental Status: He is alert.  Psychiatric:        Mood and Affect: Mood normal.        Thought Content: Thought content normal.    Encounter Diagnosis  Name Primary?  . Primary localized osteoarthritis of knee Yes   Meds ordered this encounter  Medications  . diclofenac Sodium (VOLTAREN) 1 % GEL    Sig: Apply 4 g topically 4 (four) times daily.    Dispense:  20 g    Refill:  5

## 2019-08-01 NOTE — Progress Notes (Signed)
Error

## 2019-08-19 ENCOUNTER — Telehealth (HOSPITAL_COMMUNITY): Payer: Self-pay | Admitting: *Deleted

## 2019-08-19 NOTE — Telephone Encounter (Signed)
[employer] [employer] is needing records from office but provider wants office to obtain release of information first before releasing records. Per pt, he will get the release here to office.

## 2019-08-27 ENCOUNTER — Encounter: Payer: Self-pay | Admitting: Orthopedic Surgery

## 2019-08-27 ENCOUNTER — Telehealth: Payer: Self-pay | Admitting: Orthopedic Surgery

## 2019-08-27 NOTE — Telephone Encounter (Signed)
Mr. Luis Randall called this morning around 11:30 wanting an appointment with Dr. Romeo Apple for ankle pain and swelling.  This is a new problem for him.  I told him that Dr. Romeo Apple doesn't have any immediate available appointments but I offered an appointment with Dr. Hilda Lias for this coming Tuesday.  Luis Randall was not happy about this.  He said that previously when he saw Dr. Romeo Apple, Dr Romeo Apple had told him that he didn't have to wait for an appointment, that he should just get in his car and drive down here and he would get seen.  I told Luis Randall that was not a good idea because Dr. Romeo Apple is not in the office all day everyday and that he would need to make an appointment.  He said he would just email Dr Romeo Apple and see if he could get with him about an appointment.  He then hung up on me.

## 2019-08-27 NOTE — Telephone Encounter (Signed)
Noted  

## 2019-08-28 ENCOUNTER — Encounter: Payer: Self-pay | Admitting: Orthopedic Surgery

## 2019-08-28 ENCOUNTER — Other Ambulatory Visit: Payer: Self-pay

## 2019-08-28 ENCOUNTER — Ambulatory Visit: Payer: Federal, State, Local not specified - PPO | Admitting: Orthopedic Surgery

## 2019-08-28 ENCOUNTER — Ambulatory Visit: Payer: Federal, State, Local not specified - PPO

## 2019-08-28 VITALS — BP 121/82 | HR 85 | Ht 71.0 in | Wt 242.0 lb

## 2019-08-28 DIAGNOSIS — S8261XA Displaced fracture of lateral malleolus of right fibula, initial encounter for closed fracture: Secondary | ICD-10-CM

## 2019-08-28 MED ORDER — IBUPROFEN 800 MG PO TABS: 800.0000 mg | ORAL_TABLET | Freq: Three times a day (TID) | ORAL | 1 refills | Status: DC | PRN

## 2019-08-28 MED ORDER — HYDROCODONE-ACETAMINOPHEN 7.5-325 MG PO TABS: 1.0000 | ORAL_TABLET | ORAL | 0 refills | Status: AC | PRN
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
## 2019-08-28 NOTE — Patient Instructions (Signed)
Nondisplaced Fibular Ankle Fracture Treated With Immobilization, Adult  A nondisplaced fibular ankle fracture is a simple break of the bottom of the fibula (lateral malleolus). The fibula is a bone in the lower leg, between the knee and the foot. In a nondisplaced fracture, the pieces of the broken bone line up with each other and are not out of place. This condition usually does not need surgery and can be treated with a splint or cast. What are the causes? This condition may be caused by:  A hard, direct hit (blow) or injury to the side of the leg.  A powerful twisting or rotating movement.  Rolling the ankle.  Falling or tripping. What increases the risk? You are more likely to develop this condition if:  You play sports that involve a lot of running and pivoting, such as basketball.  You play impact sports, such as football or soccer.  You smoke.  You have diabetes.  You have a history of ankle fractures.  You are obese. What are the signs or symptoms? Symptoms of this condition include:  Severe pain that begins immediately after the injury.  Bruising.  Swelling.  Inability to put weight on the injured ankle.  An ankle that is tender to the touch. How is this diagnosed? This condition is diagnosed based on:  Your medical history.  A physical exam.  Imaging tests to confirm the fracture and to evaluate the extent of the injury. These tests may include: ? X-rays. ? Stress X-ray. During this test, your health care provider will put pressure on your ankle while taking an X-ray. This will help to determine whether your ankle is stable. ? CT scan. ? MRI. How is this treated? This condition may be treated with:  A splint.  Icing and raising (elevating) the ankle.  A cast.  A removable cast or walking "boot."  Crutches. These may be needed to help you get around. Follow these instructions at home: Managing pain, stiffness, and swelling   If directed, put  ice on the injured area. ? If you have a removable splint or cast, remove it as told by your health care provider. ? Put ice in a plastic bag. ? Place a towel between your skin and the bag, or between your cast and the bag. ? Leave the ice on for 20 minutes, 2-3 times a day.  Raise (elevate) the injured area above the level of your heart while you are sitting or lying down.  Move your toes often to avoid stiffness and to lessen swelling.  Use crutches as directed. Resume walking without crutches as directed by your health care provider or when you are comfortable doing that. If you have a removable splint or cast:  Wear the removable splint or cast as told by your health care provider. Remove it only as told by your health care provider.  Loosen the splint or cast if your toes tingle, become numb, or turn cold and blue.  Keep the splint or cast clean.  If the splint or cast is not waterproof: ? Do not let it get wet. ? Cover it with a watertight covering when you take a bath or a shower. If you have a cast that cannot be removed:  Do not stick anything inside the cast to scratch your skin. Doing that increases your risk of infection.  Check the skin around the cast every day. Contact your health care provider if you notice any redness, irritation, or swelling.  You may   put lotion on dry skin around the edges of the cast. Do not put lotion on the skin underneath the cast.  Keep the cast clean.  Do not break off edges or trim your cast.  If the cast is not waterproof: ? Do not let it get wet. ? Cover it with a watertight covering when you take a bath or a shower. Activity  Do exercises and stretches as told by your health care provider.  Ask your health care provider when it is safe to drive if you have a cast or splint on your ankle.  Do not drive or use heavy machinery while taking prescription pain medicine. General instructions   Take over-the-counter and prescription  medicines only as told by your health care provider.  Do not take baths, swim, or use a hot tub until your health care provider approves. Ask your health care provider if you can take showers. You may only be allowed to take sponge baths for bathing.  Do not use the injured leg to support your body weight until your health care provider says that you can. Use crutches as told by your health care provider.  Do not use any products that contain nicotine or tobacco, such as cigarettes and e-cigarettes. These can delay bone healing. If you need help quitting, ask your health care provider.  Keep all follow-up visits as told by your health care provider. This is important. Contact a health care provider if:  Your cast gets damaged or it breaks.  Your pain does not get better with medicine. Get help right away if:  You develop severe pain or more swelling in your ankle or foot that cannot be controlled with medicines.  Your skin or nails below the injury turn blue or gray, feel cold, or become numb.  The skin under your cast burns or stings.  There is a bad smell or pus coming from under the cast.  You cannot move your toes. Summary  A nondisplaced fibular ankle fracture is a simple break of the bottom of a bone in the lower leg (fibula).  This condition may be treated with a splint, icing and elevation, a cast, or a removable cast or walking "boot." You may also need crutches to get around while your ankle heals.  To help manage pain, stiffness, swelling, put ice on the injured area as directed by your health care provider.  You should not use the injured leg to support your body weight until your health care provider says that you can. Use crutches as told by your health care provider. This information is not intended to replace advice given to you by your health care provider. Make sure you discuss any questions you have with your health care provider. Document Revised: 12/29/2016  Document Reviewed: 12/27/2015 Elsevier Patient Education  2020 [employer]  

## 2019-08-29 ENCOUNTER — Telehealth (HOSPITAL_COMMUNITY): Payer: Self-pay | Admitting: *Deleted

## 2019-08-29 NOTE — Telephone Encounter (Signed)
Called and [facility] informing that office received a form and needed to go over it with him. Office number provided. Also office need to get consent form signed by patient before form can be released and also if patient still wants to see provider he would need to sch another appt with provider.

## 2019-09-01 DIAGNOSIS — S8263XD Displaced fracture of lateral malleolus of unspecified fibula, subsequent encounter for closed fracture with routine healing: Secondary | ICD-10-CM | POA: Insufficient documentation

## 2019-09-02 ENCOUNTER — Other Ambulatory Visit: Payer: Self-pay

## 2019-09-02 ENCOUNTER — Ambulatory Visit (INDEPENDENT_AMBULATORY_CARE_PROVIDER_SITE_OTHER): Payer: Federal, State, Local not specified - PPO | Admitting: Clinical

## 2019-09-02 DIAGNOSIS — F319 Bipolar disorder, unspecified: Secondary | ICD-10-CM | POA: Diagnosis not present
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
## 2019-09-04 ENCOUNTER — Ambulatory Visit (INDEPENDENT_AMBULATORY_CARE_PROVIDER_SITE_OTHER): Payer: Federal, State, Local not specified - PPO | Admitting: Orthopedic Surgery

## 2019-09-04 ENCOUNTER — Ambulatory Visit: Payer: Federal, State, Local not specified - PPO

## 2019-09-04 ENCOUNTER — Encounter: Payer: Self-pay | Admitting: Orthopedic Surgery

## 2019-09-04 ENCOUNTER — Other Ambulatory Visit: Payer: Self-pay

## 2019-09-04 VITALS — Ht 71.0 in | Wt 242.0 lb

## 2019-09-04 DIAGNOSIS — S8261XD Displaced fracture of lateral malleolus of right fibula, subsequent encounter for closed fracture with routine healing: Secondary | ICD-10-CM | POA: Diagnosis not present
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
## 2019-09-08 IMAGING — NM NM MYOCAR MULTI W/SPECT W/WALL MOTION & EF
2 series · 12 of 12 positions shown · non-contrast
Comparison: none

[Series 1: rest · 6.51mm/px · 6 of 64 frames shown]
[frame 6/64]
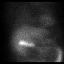
[frame 16/64]
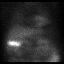
[frame 27/64]
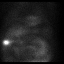
[frame 38/64]
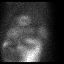
[frame 48/64]
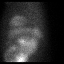
[frame 59/64]
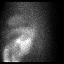

[Series 3: stress gated - perfusion · 6.51mm/px · 6 of 64 frames shown]
[frame 6/64]
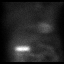
[frame 16/64]
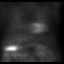
[frame 27/64]
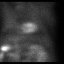
[frame 38/64]
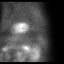
[frame 48/64]
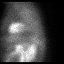
[frame 59/64]
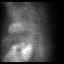

[12 of 12 positions shown; findings below may reference images not displayed]

Canned report from images found in remote index.

Refer to host system for actual result text.

## 2019-09-23 ENCOUNTER — Other Ambulatory Visit: Payer: Self-pay

## 2019-09-23 ENCOUNTER — Ambulatory Visit (INDEPENDENT_AMBULATORY_CARE_PROVIDER_SITE_OTHER): Payer: Federal, State, Local not specified - PPO | Admitting: Clinical

## 2019-09-23 DIAGNOSIS — F319 Bipolar disorder, unspecified: Secondary | ICD-10-CM

## 2019-09-23 NOTE — Progress Notes (Signed)
  Virtual Visit via Telephone Note  I connected withJeffrey H Smithon 08/24/21at 9:00 AM ESTby telephoneand verified that I am speaking with the correct person using two identifiers.  Location: Patient:Home Provider:Office  I discussed the limitations, risks, security and privacy concerns of performing an evaluation and management service by telephone and the availability of in person appointments. I also discussed with the patient that there may be a patient responsible charge related to this service. The patient expressed understanding and agreed to proceed.     THERAPIST PROGRESS NOTE  Session Time:9:00AM-9:55AM  Participation Level:Active  Behavioral Response:CasualAlertDepressed  Type of Therapy:Individual Therapy  Treatment Goals addressed:Copingand Communication  Interventions:CBT  Summary:Luis H Smithis a 58 y.o.malewho presents with Bi-Polar Disorder.The OPT therapist worked with thepatientfor his ongoing session. The OPT therapist utilized Motivational Interviewing to assist in creating therapeutic repore. The patient in the session was engaged and work in collaboration giving feedback about his triggers and symptoms over the past few weeksincluding continued conflict with his significant other. The OPT therapist utilized Cognitive Behavioral Therapy through cognitive restructuring as well as worked with the patient on coping strategies to assist in management ofmood.  Suicidal/Homicidal:Nowithout intent/plan  Therapist Response:The OPT therapist worked with the patient for the patients  scheduled session. The patient was engaged in his session and gave feedback in relation to triggers, symptoms, and behavior responses over the pastfewweeks. The OPT therapist worked with the patient utilizing an in session Cognitive Behavioral Therapy exercise. The patient was responsive in the session and verbalized, "I realize my taking a break  to help communication is not effective in that I leave for days at a time". TheOPT therapist supported the patient and worked to empower the patient on his individual focus and making changes to promote his functioning and manage his mental health symptoms and triggers. The OPT therapist worked with the patient on effective communication as it relates to his interactions with his partner The OPT therapist will continue treatment work with the patient in his next scheduled session.  Plan: Return again in3weeks.  Diagnosis:Axis I:Bipolar I disorder with anxious distress  Axis II:No diagnosis  I discussed the assessment and treatment plan with the patient. The patient was provided an opportunity to ask questions and all were answered. The patient agreed with the plan and demonstrated an understanding of the instructions.  The patient was advised to call back or seek an in-person evaluation if the symptoms worsen or if the condition fails to improve as anticipated.  I provided68minutes of non-face-to-face time during this encounter.  [name], LCSW 09/23/2019

## 2019-10-02 ENCOUNTER — Other Ambulatory Visit: Payer: Self-pay

## 2019-10-02 ENCOUNTER — Ambulatory Visit (INDEPENDENT_AMBULATORY_CARE_PROVIDER_SITE_OTHER): Payer: Federal, State, Local not specified - PPO | Admitting: Orthopedic Surgery

## 2019-10-02 ENCOUNTER — Ambulatory Visit: Payer: Federal, State, Local not specified - PPO

## 2019-10-02 DIAGNOSIS — S8261XD Displaced fracture of lateral malleolus of right fibula, subsequent encounter for closed fracture with routine healing: Secondary | ICD-10-CM | POA: Diagnosis not present

## 2019-10-02 NOTE — Patient Instructions (Signed)
Ok to walk the dogs  Careful on un-even ground
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
## 2019-10-14 ENCOUNTER — Ambulatory Visit (INDEPENDENT_AMBULATORY_CARE_PROVIDER_SITE_OTHER): Payer: Federal, State, Local not specified - PPO | Admitting: Clinical

## 2019-10-14 ENCOUNTER — Other Ambulatory Visit: Payer: Self-pay

## 2019-10-14 DIAGNOSIS — F319 Bipolar disorder, unspecified: Secondary | ICD-10-CM | POA: Diagnosis not present

## 2019-10-14 NOTE — Progress Notes (Signed)
  Virtual Visit via Telephone Note  I connected withJeffrey H Smithon 09/14/21at9:00 AM ESTby telephoneand verified that I am speaking with the correct person using two identifiers.  Location: Patient:Home Provider:Office  I discussed the limitations, risks, security and privacy concerns of performing an evaluation and management service by telephone and the availability of in person appointments. I also discussed with the patient that there may be a patient responsible charge related to this service. The patient expressed understanding and agreed to proceed.     THERAPIST PROGRESS NOTE  Session Time:9:00AM-9:45AM  Participation Level:Active  Behavioral Response:CasualAlertDepressed  Type of Therapy:Individual Therapy  Treatment Goals addressed:Copingand Communication  Interventions:CBT  Summary:Luis H Smithis a 59 y.o.malewho presents with Bi-Polar Disorder.The OPT therapist worked with thepatientfor hisongoingsession. The OPT therapist utilized Motivational Interviewing to assist in creating therapeutic repore. The patient in the session was engaged and work in collaboration giving feedback about his triggers and symptoms over the past few weeksincludingtransition in separation from his partner. The OPT therapist utilized Cognitive Behavioral Therapy through cognitive restructuring as well as worked with the patient on coping strategies to assist in management ofmood. The OPT therapist provided psycho-education throughout the session and worked to gauge impact of transition on the patients mental health.  Suicidal/Homicidal:Nowithout intent/plan  Therapist Response:The OPT therapist worked with the patient for the patients scheduled session. The patient was engaged in his session and gave feedback in relation to triggers, symptoms, and behavior responses over the pastfewweeks. The OPT therapist worked with the patient utilizing an  in session Cognitive Behavioral Therapy exercise. The patient was responsive in the session and verbalized, "I have moved out into a new place and I feel that she is not good for me and I need to just stay away from her and keep walking in the other direction". TheOPT therapist supported the patientand worked to empower the patient on his individual focus and making changes to promote his functioning and manage his mental health symptoms and triggers. The OPT therapist will continue treatment work with the patient in his next scheduled session.  Plan: Return again in3weeks.  Diagnosis:Axis I:Bipolar I disorder with anxious distress  Axis II:No diagnosis  I discussed the assessment and treatment plan with the patient. The patient was provided an opportunity to ask questions and all were answered. The patient agreed with the plan and demonstrated an understanding of the instructions.  The patient was advised to call back or seek an in-person evaluation if the symptoms worsen or if the condition fails to improve as anticipated.  I provided36minutes of non-face-to-face time during this encounter.  [name], LCSW 10/14/2019

## 2019-10-22 ENCOUNTER — Other Ambulatory Visit: Payer: Self-pay | Admitting: Orthopedic Surgery

## 2019-10-22 DIAGNOSIS — S8261XA Displaced fracture of lateral malleolus of right fibula, initial encounter for closed fracture: Secondary | ICD-10-CM

## 2019-11-04 ENCOUNTER — Ambulatory Visit (INDEPENDENT_AMBULATORY_CARE_PROVIDER_SITE_OTHER): Payer: Federal, State, Local not specified - PPO | Admitting: Clinical

## 2019-11-04 ENCOUNTER — Other Ambulatory Visit: Payer: Self-pay

## 2019-11-04 DIAGNOSIS — F319 Bipolar disorder, unspecified: Secondary | ICD-10-CM

## 2019-11-04 NOTE — Progress Notes (Signed)
  Virtual Visit via Telephone Note  I connected withJeffrey H Smithon 10/05/21at10:00 AM ESTby telephoneand verified that I am speaking with the correct person using two identifiers.  Location: Patient:Home Provider:Office  I discussed the limitations, risks, security and privacy concerns of performing an evaluation and management service by telephone and the availability of in person appointments. I also discussed with the patient that there may be a patient responsible charge related to this service. The patient expressed understanding and agreed to proceed.     THERAPIST PROGRESS NOTE  Session Time:10:00AM-10:45AM  Participation Level:Active  Behavioral Response:CasualAlertDepressed  Type of Therapy:Individual Therapy  Treatment Goals addressed:Copingand Communication  Interventions:CBT  Summary:Luis H Smithis a 58 y.o.malewho presents with Bi-Polar Disorder.The OPT therapist worked with thepatientfor hisongoingsession. The OPT therapist utilized Motivational Interviewing to assist in creating therapeutic repore. The patient in the session was engaged and work in collaboration giving feedback about his triggers and symptoms over the past few weeksincluding ongoing adjustment of separation from his partner. The OPT therapist utilized Cognitive Behavioral Therapy through cognitive restructuring as well as worked with the patient on coping strategies to assist in management ofmood. The OPT therapist provided psycho-education throughout the session and encouragement of ongoing use and involvement of protective factors.  Suicidal/Homicidal:Nowithout intent/plan  Therapist Response:The OPT therapist worked with the patient for the patients scheduled session. The patient was engaged in his session and gave feedback in relation to triggers, symptoms, and behavior responses over the pastfewweeks. The OPT therapist worked with the patient  utilizing an in session Cognitive Behavioral Therapy exercise. The patient was responsive in the session and verbalized, "I have been more involved in my church and I have been applying for work". TheOPT therapist supported the patientand worked to empower the patient on his individual focus and making changes to promote his functioning and manage his mental health symptoms and triggers.The OPT therapist will continue treatment work with the patient in his next scheduled session.  Plan: Return again in3weeks.  Diagnosis:Axis I:Bipolar I disorder with anxious distress  Axis II:No diagnosis  I discussed the assessment and treatment plan with the patient. The patient was provided an opportunity to ask questions and all were answered. The patient agreed with the plan and demonstrated an understanding of the instructions.  The patient was advised to call back or seek an in-person evaluation if the symptoms worsen or if the condition fails to improve as anticipated.  I provided70minutes of non-face-to-face time during this encounter.  Luis Burn, LCSW 11/04/2019

## 2019-11-25 ENCOUNTER — Other Ambulatory Visit: Payer: Self-pay

## 2019-11-25 ENCOUNTER — Ambulatory Visit (HOSPITAL_COMMUNITY): Payer: Federal, State, Local not specified - PPO | Admitting: Clinical

## 2019-12-04 ENCOUNTER — Ambulatory Visit: Payer: Federal, State, Local not specified - PPO | Admitting: Orthopedic Surgery

## 2020-01-01 NOTE — Telephone Encounter (Signed)
Patient also called and left message regarding status of [facility] referral.  Patient ph# [phone]

## 2020-01-13 ENCOUNTER — Ambulatory Visit (INDEPENDENT_AMBULATORY_CARE_PROVIDER_SITE_OTHER): Payer: Federal, State, Local not specified - PPO | Admitting: Clinical

## 2020-01-13 ENCOUNTER — Other Ambulatory Visit: Payer: Self-pay

## 2020-01-13 DIAGNOSIS — F319 Bipolar disorder, unspecified: Secondary | ICD-10-CM | POA: Diagnosis not present
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
## 2020-01-15 ENCOUNTER — Telehealth: Payer: Self-pay | Admitting: Orthopedic Surgery

## 2020-01-15 NOTE — Telephone Encounter (Signed)
Noted. We now have referral following Dr Mort Sawyers re-review (will update in referral workqueue) approval for right ankle and right knee; however, patient asked yesterday about scheduling an appointment for left knee, for which he had been seen by Dr Romeo Apple last for left knee on 08/01/19, prior to [facility] coverage. We will call back to patient to offer appointment, and also clarify which knee he is asking to be seen for.  Per phone with Marcelle Smiling at [facility] ph#[phone], if patient requests to be seen for left knee, states he would need to return to [place] to be evaluated for left knee. See referral for update.

## 2020-01-15 NOTE — Telephone Encounter (Signed)
-----   Message from [facility] May, RT sent at 01/15/2020  9:18 AM EST ----- Regarding: RE: Request for appt, same pt, previous problem Dr Romeo Apple returned the [facility] packet back and it said to schedule him on a certain date.  See if you have that at the front somewhere?  We can bill thru [place].   ----- Message ----- From: Doristine Section Sent: 01/14/2020   3:00 PM EST To: Cherre Huger, RT Subject: Request for appt, same pt, previous problem    Toniann Fail, Call received from patient Luis Randall [phone] - requests appointment with Dr Romeo Apple for problem last seen for 08/01/19, left knee pain, swelling.  Patient maintains that his coverage is still [facility]. Most recent referral is from [facility], which is still being re- reviewed. Please advise.  In meantime, I am contacting [employer] to verify which coverage is to be primary.

## 2020-01-26 NOTE — Telephone Encounter (Signed)
Patient called back regarding appointment for left knee, which is not listed on [facility] care referral; therefore, requests to file his [employer] -

## 2020-02-09 ENCOUNTER — Other Ambulatory Visit: Payer: Self-pay

## 2020-02-09 ENCOUNTER — Ambulatory Visit: Payer: Federal, State, Local not specified - PPO | Admitting: Orthopedic Surgery

## 2020-02-09 ENCOUNTER — Ambulatory Visit: Payer: Federal, State, Local not specified - PPO

## 2020-02-09 ENCOUNTER — Encounter: Payer: Self-pay | Admitting: Orthopedic Surgery

## 2020-02-09 ENCOUNTER — Telehealth: Payer: Self-pay | Admitting: Radiology

## 2020-02-09 VITALS — BP 131/87 | HR 77 | Ht 71.0 in | Wt 245.0 lb

## 2020-02-09 DIAGNOSIS — M25562 Pain in left knee: Secondary | ICD-10-CM

## 2020-02-09 DIAGNOSIS — M1710 Unilateral primary osteoarthritis, unspecified knee: Secondary | ICD-10-CM

## 2020-02-09 DIAGNOSIS — G8929 Other chronic pain: Secondary | ICD-10-CM

## 2020-02-09 DIAGNOSIS — M1712 Unilateral primary osteoarthritis, left knee: Secondary | ICD-10-CM

## 2020-02-09 NOTE — Progress Notes (Signed)
Chief Complaint  Patient presents with  . Knee Pain    Left knee    60 year old male with chronic patellofemoral arthritis presents with worsening left knee pain  Review of systems reported also having some radicular pain in his left leg behind the knee and into the anterior compartment and top of the foot  Examination reveals tenderness of the patellofemoral joint and crepitance with slight flexion contracture.  Range of motion totals 120 degrees knee stable skin intact  X-rays previously show patellofemoral arthritis  Recommend injection of cortisone today in the joint  Procedure note left knee injection   verbal consent was obtained to inject left knee joint  Timeout was completed to confirm the site of injection  The medications used were 40 mg of Depo-Medrol and 1% lidocaine 3 cc  Anesthesia was provided by ethyl chloride and the skin was prepped with alcohol.  After cleaning the skin with alcohol a 20-gauge needle was used to inject the left knee joint. There were no complications. A sterile bandage was applied.   Recommend hyaluronic acid injection  Once we get approval we will call him to get those done as well  Chronic pain exacerbation injection

## 2020-02-09 NOTE — Telephone Encounter (Signed)
[employer]  Whatever they will cover Will you look at authorization

## 2020-02-09 NOTE — Patient Instructions (Signed)
synvisc / orthovisc pending

## 2020-02-10 ENCOUNTER — Ambulatory Visit (HOSPITAL_COMMUNITY): Payer: Federal, State, Local not specified - PPO | Admitting: Clinical

## 2020-02-11 ENCOUNTER — Telehealth: Payer: Self-pay | Admitting: Orthopedic Surgery

## 2020-02-11 NOTE — Telephone Encounter (Signed)
Patient called during lunch and left a voicemail.  He wants to know how long does it take for the injection to take effect.  He said he is still having severe knee pain.    Would you please call him at [phone]?  Thanks

## 2020-02-11 NOTE — Telephone Encounter (Signed)
I called patient to advise may take up to a week to feel better after the injection. Instructed to ice, rest, and use the Ibuprofen and voltaren gel. He states he will  [place]

## 2020-02-12 NOTE — Telephone Encounter (Signed)
Submitted online [id number] for Supartz x 3.  No PA started yet, will wait on VOB first. Left knee.

## 2020-02-12 NOTE — Telephone Encounter (Signed)
Supartz covered, no PA.  $40 copay per visit 20610 covered at 100% Supartz covered at 70%  Please call patient and tell him, and let me know if he wants to proceed and I will order the Supartz.

## 2020-02-16 ENCOUNTER — Other Ambulatory Visit: Payer: Self-pay

## 2020-02-16 ENCOUNTER — Ambulatory Visit (HOSPITAL_COMMUNITY): Payer: Federal, State, Local not specified - PPO | Admitting: Clinical

## 2020-02-17 NOTE — Telephone Encounter (Signed)
Have told him to call his insurance also to see what they allow, explained to him we bill $375 per injection and his insurance pays 70 percent, plus he has copay.  He disconnected call as I was advising him to call his insurance company. He states he got the injections for free in another state.

## 2020-02-18 ENCOUNTER — Ambulatory Visit: Payer: No Typology Code available for payment source | Admitting: Orthopedic Surgery

## 2020-02-23 NOTE — Telephone Encounter (Signed)
I will order, please call and schedule him for next week sometime for the first injection?  He needs three total appts,  7-10 days apart.  If you want to schedule the second/third appts too that will be fine.  Thank you.

## 2020-02-23 NOTE — Telephone Encounter (Signed)
Patient called back and wants to proceed with Supartz and he is ok with the estimated out of pocket cost. Who do we need to call and order? Please advise.

## 2020-02-24 NOTE — Telephone Encounter (Signed)
Supartz ordered.  Will ship to [facility] and I will pickup and bring here to [place] for patient to start them next week.

## 2020-02-25 ENCOUNTER — Telehealth: Payer: Self-pay | Admitting: Orthopedic Surgery

## 2020-02-25 NOTE — Telephone Encounter (Signed)
Noted  

## 2020-02-25 NOTE — Telephone Encounter (Signed)
Annice Pih from the [place] called to see if he came to his appt.  Advised her no he was going to call us back to reschedule. She is going to send him a letter in the mail today and give him 10 days to reply.  If he doesn't by then she is going to close the referral out.

## 2020-02-26 ENCOUNTER — Ambulatory Visit (INDEPENDENT_AMBULATORY_CARE_PROVIDER_SITE_OTHER): Payer: Federal, State, Local not specified - PPO | Admitting: Clinical

## 2020-02-26 ENCOUNTER — Other Ambulatory Visit: Payer: Self-pay

## 2020-02-26 DIAGNOSIS — F319 Bipolar disorder, unspecified: Secondary | ICD-10-CM

## 2020-02-26 NOTE — Progress Notes (Signed)
  Virtual Visit via Telephone Note  I connected withJeffrey H Smithon1/27/21at11:00 AM ESTby telephoneand verified that I am speaking with the correct person using two identifiers.  Location: Patient:Home Provider:Office  I discussed the limitations, risks, security and privacy concerns of performing an evaluation and management service by telephone and the availability of in person appointments. I also discussed with the patient that there may be a patient responsible charge related to this service. The patient expressed understanding and agreed to proceed.     THERAPIST PROGRESS NOTE  Session Time:11:00AM-11:30AM  Participation Level:Active  Behavioral Response:CasualAlertDepressed  Type of Therapy:Individual Therapy  Treatment Goals addressed:Copingand Communication  Interventions:CBT  Summary:Luis H Smithis a 60 y.o.malewho presents with Bi-Polar Disorder.The OPT therapist worked with thepatientfor hisongoingsession. The OPT therapist utilized Motivational Interviewing to assist in creating therapeutic repore. The patient in the session was engaged and work in collaboration giving feedback about his triggers and symptoms over the past few weeksincluding ongoing adjustment in separation from his partner and upcoming divorce proceedings. The OPT therapist utilized Cognitive Behavioral Therapy through cognitive restructuring as well as worked with the patient on coping strategies to assist in management ofmood.The OPT therapist provided psycho-education throughout the session andencouragement of ongoing use and involvement of protective factors. The patient verbalized he is currently doing much better with his decision to move forward with Divorce. Additionally the patient has started a new part time job  Suicidal/Homicidal:Nowithout intent/plan  Therapist Response:The OPT therapist worked with the patient for the patients scheduled  session. The patient was engaged in his session and gave feedback in relation to triggers, symptoms, and behavior responses over the pastfewweeks. The OPT therapist worked with the patient utilizing an in session Cognitive Behavioral Therapy exercise. The patient was responsive in the session and verbalized, "Luis Randall picked up a part time job and this is helping me with my time and keeping me busy so I can get out of the house". TheOPT therapist supported the patientand worked to empower the patient on his individual focus and making changes to promote his functioning and manage his mental health symptoms and triggers. The patient verbalized his commitment and work to make changes in his life based on his faith. The patient identified his interaction with his wife most recently cordal and is in the hopes post Divorce of maintaining friendship.The OPT therapist will continue treatment work with the patient in his next scheduled session.  Plan: Return again in3/4weeks.  Diagnosis:Axis I:Bipolar I disorder with anxious distress  Axis II:No diagnosis  I discussed the assessment and treatment plan with the patient. The patient was provided an opportunity to ask questions and all were answered. The patient agreed with the plan and demonstrated an understanding of the instructions.  The patient was advised to call back or seek an in-person evaluation if the symptoms worsen or if the condition fails to improve as anticipated.  I provided34minutes of non-face-to-face time during this encounter.  Luis Burn, LCSW  02/26/2020

## 2020-03-02 NOTE — Telephone Encounter (Signed)
Con Memos will be here Wed pm and patient is scheduled for injections.

## 2020-03-11 ENCOUNTER — Encounter: Payer: Self-pay | Admitting: Orthopedic Surgery

## 2020-03-11 ENCOUNTER — Other Ambulatory Visit: Payer: Self-pay

## 2020-03-11 ENCOUNTER — Ambulatory Visit: Payer: No Typology Code available for payment source | Admitting: Orthopedic Surgery

## 2020-03-11 DIAGNOSIS — M1712 Unilateral primary osteoarthritis, left knee: Secondary | ICD-10-CM | POA: Diagnosis not present

## 2020-03-11 DIAGNOSIS — M1710 Unilateral primary osteoarthritis, unspecified knee: Secondary | ICD-10-CM

## 2020-03-11 DIAGNOSIS — G8929 Other chronic pain: Secondary | ICD-10-CM

## 2020-03-11 NOTE — Progress Notes (Signed)
Chief Complaint  Patient presents with  . Knee Pain    Left knee injection    supartz [id number]  1/3  Procedure note for injection of hyaluronic acid   Diagnosis osteoarthritis of the knee  Verbal consent was obtained to inject the knee with HYALURONIC ACID . Timeout was completed to confirm the injection site as the LEFT Knee  Ethyl chloride spray was used for anesthesia Alcohol was used to prep the skin. The infrapatellar lateral portal was used as an injection site and 1 vial of hyaluronic acid  was injected into the knee  Specific Co. Preparation: SUPARTZ   No complications were noted  Encounter Diagnoses  Name Primary?  . Chronic pain of left knee Yes  . Primary localized osteoarthritis of knee

## 2020-03-18 ENCOUNTER — Ambulatory Visit (INDEPENDENT_AMBULATORY_CARE_PROVIDER_SITE_OTHER): Payer: No Typology Code available for payment source | Admitting: Orthopedic Surgery

## 2020-03-18 ENCOUNTER — Other Ambulatory Visit: Payer: Self-pay

## 2020-03-18 ENCOUNTER — Encounter: Payer: Self-pay | Admitting: Orthopedic Surgery

## 2020-03-18 DIAGNOSIS — M1712 Unilateral primary osteoarthritis, left knee: Secondary | ICD-10-CM

## 2020-03-18 DIAGNOSIS — G8929 Other chronic pain: Secondary | ICD-10-CM

## 2020-03-18 DIAGNOSIS — M1710 Unilateral primary osteoarthritis, unspecified knee: Secondary | ICD-10-CM

## 2020-03-18 NOTE — Progress Notes (Signed)
Chief Complaint  Patient presents with  . Injections    Supartz left knee #2 lot [id number]   Supartz injection 3 left knee no complications to date   Procedure note for injection of hyaluronic acid   Diagnosis osteoarthritis of the knee  Verbal consent was obtained to inject the knee with HYALURONIC ACID . Timeout was completed to confirm the injection site as the left   knee  Ethyl chloride spray was used for anesthesia Alcohol was used to prep the skin. The infrapatellar lateral portal was used as an injection site and 1 vial of hyaluronic acid  was injected into the knee  Specific Co. Preparation: Supartz  Encounter Diagnoses  Name Primary?  . Chronic pain of left knee Yes  . Primary localized osteoarthritis of knee      No complications were noted

## 2020-03-25 ENCOUNTER — Telehealth: Payer: Self-pay | Admitting: Orthopedic Surgery

## 2020-03-25 ENCOUNTER — Ambulatory Visit: Payer: Federal, State, Local not specified - PPO | Admitting: Orthopedic Surgery

## 2020-03-25 NOTE — Telephone Encounter (Signed)
He is coming Monday at 10:40

## 2020-03-25 NOTE — Telephone Encounter (Signed)
Next available appointment spot
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
## 2020-03-29 ENCOUNTER — Ambulatory Visit (INDEPENDENT_AMBULATORY_CARE_PROVIDER_SITE_OTHER): Payer: No Typology Code available for payment source | Admitting: Orthopedic Surgery

## 2020-03-29 ENCOUNTER — Other Ambulatory Visit: Payer: Self-pay

## 2020-03-29 ENCOUNTER — Encounter: Payer: Self-pay | Admitting: Orthopedic Surgery

## 2020-03-29 VITALS — BP 117/79 | HR 75

## 2020-03-29 DIAGNOSIS — M1712 Unilateral primary osteoarthritis, left knee: Secondary | ICD-10-CM

## 2020-03-29 DIAGNOSIS — M25562 Pain in left knee: Secondary | ICD-10-CM

## 2020-03-29 DIAGNOSIS — G8929 Other chronic pain: Secondary | ICD-10-CM

## 2020-03-29 NOTE — Progress Notes (Signed)
Chief Complaint  Patient presents with  . Knee Pain    L/doing ok/here for last injection   3/3   Procedure note for injection of hyaluronic acid    Diagnosis osteoarthritis of the knee   Verbal consent was obtained to inject the knee with HYALURONIC ACID . Timeout was completed to confirm the injection site as the left   knee   Ethyl chloride spray was used for anesthesia Alcohol was used to prep the skin. The infrapatellar lateral portal was used as an injection site and 1 vial of hyaluronic acid  was injected into the knee   Specific Co. Preparation: Supartz       Encounter Diagnoses  Name Primary?  . Chronic pain of left knee Yes  . Primary localized osteoarthritis of knee

## 2020-03-29 NOTE — Patient Instructions (Signed)
Ice as needed

## 2020-04-01 ENCOUNTER — Other Ambulatory Visit: Payer: Self-pay

## 2020-04-01 ENCOUNTER — Ambulatory Visit (INDEPENDENT_AMBULATORY_CARE_PROVIDER_SITE_OTHER): Payer: Federal, State, Local not specified - PPO | Admitting: Clinical

## 2020-04-01 DIAGNOSIS — F319 Bipolar disorder, unspecified: Secondary | ICD-10-CM

## 2020-04-01 NOTE — Progress Notes (Signed)
  Virtual Visit via Telephone Note  I connected withJeffrey H Smithon3/3/22at10:00 AM ESTby telephoneand verified that I am speaking with the correct person using two identifiers.  Location: Patient:Home Provider:Office  I discussed the limitations, risks, security and privacy concerns of performing an evaluation and management service by telephone and the availability of in person appointments. I also discussed with the patient that there may be a patient responsible charge related to this service. The patient expressed understanding and agreed to proceed.     THERAPIST PROGRESS NOTE  Session Time:10:00AM-10:20AM  Participation Level:Active  Behavioral Response:CasualAlertDepressed  Type of Therapy:Individual Therapy  Treatment Goals addressed:Copingand Communication  Interventions:CBT  Summary:Luis H Smithis a 60 y.o.malewho presents with Bi-Polar Disorder.The OPT therapist worked with thepatientfor hisongoingsession. The OPT therapist utilized Motivational Interviewing to assist in creating therapeutic repore. The patient in the session was engaged and work in collaboration giving feedback about his triggers and symptoms over the past few weeksincluding formally filing for divorce. The OPT therapist utilized Cognitive Behavioral Therapy through cognitive restructuring as well as worked with the patient on coping strategies to assist in management ofmood.The OPT therapist provided psycho-education throughout the session andencouragement of ongoing use and involvement of protective factors. The patient was not able to sustain his part time job and is looking for a new part time job.  Suicidal/Homicidal:Nowithout intent/plan  Therapist Response:The OPT therapist worked with the patient for the patients scheduled session. The patient was engaged in his session and gave feedback in relation to triggers, symptoms, and behavior responses  over the pastfewweeks. The OPT therapist worked with the patient utilizing an in session Cognitive Behavioral Therapy exercise. The patient was responsive in the session and verbalized, "Luis Randall some good prospects on a new part time job". TheOPT therapist supported the patientand worked to empower the patient on his individual focus and making changes to promote his functioning and manage his mental health symptoms and triggers.The patient identified formally filing for his Divorce.The OPT therapist will continue treatment work with the patient in his next scheduled session.  Plan: Return again in3/4weeks.  Diagnosis:Axis I:Bipolar I disorder with anxious distress  Axis II:No diagnosis  I discussed the assessment and treatment plan with the patient. The patient was provided an opportunity to ask questions and all were answered. The patient agreed with the plan and demonstrated an understanding of the instructions.  The patient was advised to call back or seek an in-person evaluation if the symptoms worsen or if the condition fails to improve as anticipated.  I provided26minutes of non-face-to-face time during this encounter.  Luis Burn, LCSW  04/01/2020

## 2020-04-29 ENCOUNTER — Ambulatory Visit (INDEPENDENT_AMBULATORY_CARE_PROVIDER_SITE_OTHER): Payer: Federal, State, Local not specified - PPO | Admitting: Clinical

## 2020-04-29 ENCOUNTER — Other Ambulatory Visit: Payer: Self-pay

## 2020-04-29 DIAGNOSIS — F319 Bipolar disorder, unspecified: Secondary | ICD-10-CM

## 2020-04-29 NOTE — Progress Notes (Signed)
Virtual Visit via Telephone Note  I connected withJeffrey H Smithon3/31/22at10:00 AM ESTby telephoneand verified that I am speaking with the correct person using two identifiers.  Location: Patient:Home Provider:Office  I discussed the limitations, risks, security and privacy concerns of performing an evaluation and management service by telephone and the availability of in person appointments. I also discussed with the patient that there may be a patient responsible charge related to this service. The patient expressed understanding and agreed to proceed.     THERAPIST PROGRESS NOTE  Session Time:10:00AM-10:25AM  Participation Level:Active  Behavioral Response:CasualAlertDepressed  Type of Therapy:Individual Therapy  Treatment Goals addressed:Copingand Communication  Interventions:CBT  Summary:Luis H Smithis a 60 y.o.malewho presents with Bi-Polar Disorder.The OPT therapist worked with thepatientfor hisongoingsession. The OPT therapist utilized Motivational Interviewing to assist in creating therapeutic repore. The patient in the session was engaged and work in collaboration giving feedback about his triggers and symptoms over the past few weeks. The patient is  currently visiting family [place] with prospects of moving closer to family in [place] . The OPT therapist utilized Cognitive Behavioral Therapy through cognitive restructuring as well as worked with the patient on coping strategies to assist in management ofmood.The OPT therapist provided psycho-education throughout the session andencouragement of ongoing use and involvement of protective factors. The patient noted his Divorce hearing will soon finalize his divorce and he is feeling elated to be at a place to move on with his life. The patient has started new medication prescribed to the patient through the [place].  Suicidal/Homicidal:Nowithout intent/plan  Therapist Response:The  OPT therapist worked with the patient for the patients scheduled session. The patient was engaged in his session and gave feedback in relation to triggers, symptoms, and behavior responses over the pastfewweeks. The OPT therapist worked with the patient utilizing an in session Cognitive Behavioral Therapy exercise. The patient was responsive in the session and verbalized, "Iam currently shopping for a RV and I am strongly considering moving here to [place] to be closer to my family". TheOPT therapist supported the patientand worked to empower the patient on his individual focus and making changes to promote his functioning and manage his mental health symptoms and triggers.The OPT therapist will continue treatment work with the patient in his next scheduled session.  Plan: Return again in3/4weeks.  Diagnosis:Axis I:Bipolar I disorder with anxious distress  Axis II:No diagnosis  I discussed the assessment and treatment plan with the patient. The patient was provided an opportunity to ask questions and all were answered. The patient agreed with the plan and demonstrated an understanding of the instructions.  The patient was advised to call back or seek an in-person evaluation if the symptoms worsen or if the condition fails to improve as anticipated.  I provided29minutes of non-face-to-face time during this encounter.  [name], LCSW  04/29/2020

## 2020-05-27 ENCOUNTER — Ambulatory Visit: Payer: Federal, State, Local not specified - PPO | Admitting: Orthopedic Surgery

## 2020-06-16 ENCOUNTER — Ambulatory Visit (HOSPITAL_COMMUNITY): Payer: Federal, State, Local not specified - PPO | Admitting: Clinical

## 2020-06-16 ENCOUNTER — Other Ambulatory Visit: Payer: Self-pay

## 2021-05-30 ENCOUNTER — Ambulatory Visit: Payer: No Typology Code available for payment source | Admitting: Orthopedic Surgery

## 2021-06-16 ENCOUNTER — Ambulatory Visit (INDEPENDENT_AMBULATORY_CARE_PROVIDER_SITE_OTHER): Payer: Federal, State, Local not specified - PPO | Admitting: Orthopedic Surgery

## 2021-06-16 DIAGNOSIS — G8929 Other chronic pain: Secondary | ICD-10-CM

## 2021-06-16 DIAGNOSIS — M23204 Derangement of unspecified medial meniscus due to old tear or injury, left knee: Secondary | ICD-10-CM | POA: Diagnosis not present

## 2021-06-16 DIAGNOSIS — M25562 Pain in left knee: Secondary | ICD-10-CM

## 2021-06-16 MED ORDER — MELOXICAM 7.5 MG PO TABS: 7.5000 mg | ORAL_TABLET | Freq: Every day | ORAL | 0 refills | Status: DC

## 2021-06-16 NOTE — Progress Notes (Signed)
Chief Complaint  Patient presents with   Knee Pain    LT knee/ pain comes and goes but when it is painful interferes with daily activities    Luis Randall has chronic left knee pain he comes in today with intermittent symptoms which seems to come and go without trauma he did take some Aleve when it was hurting.  He says it is episodic seems to come and go but when present he can walk  System review no evidence of trauma numbness or tingling left leg  Focused exam of the left knee reveals tenderness on the medial joint line meniscal signs are negative full extension obtained full flexion no effusion no instability  Previous films were done in 2022 mild narrowing of the medial side grade 1 arthritis  Impression possible medial meniscus tear  Recommend meloxicam when symptomatic come back in 6 to 7 weeks if no improvement MRI  Encounter Diagnoses  Name Primary?   Chronic pain of left knee Yes   Degenerative tear of medial meniscus of left knee    Meds ordered this encounter  Medications   meloxicam (MOBIC) 7.5 MG tablet    Sig: Take 1 tablet (7.5 mg total) by mouth daily. Take 1 tablet once daily by mouth only if hurting or if you have mild discomfort    Dispense:  30 tablet    Refill:  0

## 2021-06-16 NOTE — Patient Instructions (Addendum)
Take medication as directed  Follow up with Dr. Romeo Apple in 7 weeks

## 2021-07-04 ENCOUNTER — Telehealth: Payer: Self-pay | Admitting: Orthopedic Surgery

## 2021-07-04 NOTE — Telephone Encounter (Signed)
Patient called via voice message/ call returned. Patient relays that he is taking the Meloxicam however it does not seem to be helping much. Last note indicates to take for 6 to 7 weeks and to follow back up - patient is aware of appointment scheduled 08/05/21. Please advise based on current schedule through 07/18/21 while Dr Aline Brochure is still in clinic, as to scheduling prior to patient's appointment date of 08/05/21 or if other recommendation.

## 2021-07-05 ENCOUNTER — Other Ambulatory Visit: Payer: Self-pay | Admitting: Orthopedic Surgery

## 2021-07-05 DIAGNOSIS — G8929 Other chronic pain: Secondary | ICD-10-CM

## 2021-07-05 MED ORDER — MELOXICAM 7.5 MG PO TABS: 7.5000 mg | ORAL_TABLET | Freq: Two times a day (BID) | ORAL | 0 refills | Status: DC

## 2021-07-05 NOTE — Telephone Encounter (Signed)
Left  message so I can advise him to increase the Meloxicam to bid

## 2021-07-05 NOTE — Telephone Encounter (Signed)
Ok no new appt for anybody unless its an emergency  tell him to try twice a day

## 2021-07-05 NOTE — Telephone Encounter (Signed)
Patient left message in response stating okay to leave the detailed message on his phone voice mail if he does not answer.

## 2021-07-05 NOTE — Progress Notes (Signed)
Meds ordered this encounter  Medications   meloxicam (MOBIC) 7.5 MG tablet    Sig: Take 1 tablet (7.5 mg total) by mouth 2 (two) times daily. Take 1 tablet once daily by mouth only if hurting or if you have mild discomfort    Dispense:  60 tablet    Refill:  0

## 2021-07-05 NOTE — Telephone Encounter (Signed)
He needs refill to [facility]

## 2021-08-04 ENCOUNTER — Ambulatory Visit: Payer: Federal, State, Local not specified - PPO | Admitting: Orthopedic Surgery

## 2021-08-04 ENCOUNTER — Encounter: Payer: Self-pay | Admitting: Orthopedic Surgery

## 2021-08-04 VITALS — Ht 71.0 in | Wt 245.0 lb

## 2021-08-04 DIAGNOSIS — G8929 Other chronic pain: Secondary | ICD-10-CM | POA: Diagnosis not present

## 2021-08-04 DIAGNOSIS — M25562 Pain in left knee: Secondary | ICD-10-CM

## 2021-08-04 DIAGNOSIS — F3176 Bipolar disorder, in full remission, most recent episode depressed: Secondary | ICD-10-CM | POA: Insufficient documentation

## 2021-08-04 DIAGNOSIS — I8393 Asymptomatic varicose veins of bilateral lower extremities: Secondary | ICD-10-CM | POA: Insufficient documentation

## 2021-08-04 DIAGNOSIS — M23204 Derangement of unspecified medial meniscus due to old tear or injury, left knee: Secondary | ICD-10-CM

## 2021-08-04 DIAGNOSIS — H2513 Age-related nuclear cataract, bilateral: Secondary | ICD-10-CM | POA: Insufficient documentation

## 2021-08-04 NOTE — Progress Notes (Signed)
Chief Complaint  Patient presents with   Knee Pain    Lt knee pain, having more bad days.   Luis Randall has been followed for year and a half with medial and diffuse knee pain.  His x-rays show patellofemoral arthritis and he was treated with injection and hyaluronic acid as well as anti-inflammatories including meloxicam  He presents now saying his knee is 85% worse.  He has more popping especially at night  The medial joint line is tender he is walking with a slight limp he has an effusion he has good flexion pain in extension and a positive McMurray's sign  Recommend MRI of the left knee probably has a medial meniscal tear  Question will be whether he needs a knee replacement or knee arthroscopy  Patient understands our reasoning and agrees to proceed with MRI of the knee and follow-up here in the office to discuss the scan and further treatment  Encounter Diagnoses  Name Primary?   Chronic pain of left knee Yes   Degenerative tear of medial meniscus of left knee

## 2021-08-04 NOTE — Patient Instructions (Addendum)
While we are working on your approval for MRI please go ahead and call to schedule your appointment with [facility] within at least one (1) week.   Central Scheduling [phone]  For pain   Take 500 mg of tylenol every 6 hrs   Use ice pack 30 min at a time when you see swelling

## 2021-08-08 ENCOUNTER — Encounter: Payer: Self-pay | Admitting: Orthopedic Surgery

## 2021-08-08 ENCOUNTER — Telehealth: Payer: Self-pay | Admitting: Orthopedic Surgery

## 2021-08-08 NOTE — Telephone Encounter (Signed)
Note issued/Called patient to notify note is ready for  pick up.

## 2021-08-08 NOTE — Telephone Encounter (Signed)
Patient called to request a work note - states that he has a little job, and that his job is requiring a note to relay that he is under Dr [employer] care, and said he has been out of work  since he started coming for this knee problem, 08/04/21. States his job duties include prolonged standing and climbing stairs. Please advise.

## 2021-08-17 ENCOUNTER — Ambulatory Visit (HOSPITAL_COMMUNITY)
Admission: RE | Admit: 2021-08-17 | Discharge: 2021-08-17 | Disposition: A | Discharge status: Home / Self Care | Payer: Federal, State, Local not specified - PPO | Source: Ambulatory Visit | Attending: Orthopedic Surgery | Admitting: Orthopedic Surgery

## 2021-08-17 DIAGNOSIS — M25562 Pain in left knee: Secondary | ICD-10-CM | POA: Insufficient documentation

## 2021-08-17 DIAGNOSIS — G8929 Other chronic pain: Secondary | ICD-10-CM | POA: Insufficient documentation

## 2021-08-22 ENCOUNTER — Encounter: Payer: Self-pay | Admitting: Orthopedic Surgery

## 2021-08-22 ENCOUNTER — Ambulatory Visit (INDEPENDENT_AMBULATORY_CARE_PROVIDER_SITE_OTHER): Payer: Federal, State, Local not specified - PPO | Admitting: Orthopedic Surgery

## 2021-08-22 VITALS — Ht 70.0 in | Wt 265.0 lb

## 2021-08-22 DIAGNOSIS — M1710 Unilateral primary osteoarthritis, unspecified knee: Secondary | ICD-10-CM

## 2021-08-22 DIAGNOSIS — M8430XA Stress fracture, unspecified site, initial encounter for fracture: Secondary | ICD-10-CM

## 2021-08-22 DIAGNOSIS — M23204 Derangement of unspecified medial meniscus due to old tear or injury, left knee: Secondary | ICD-10-CM | POA: Diagnosis not present

## 2021-08-22 DIAGNOSIS — M1712 Unilateral primary osteoarthritis, left knee: Secondary | ICD-10-CM | POA: Diagnosis not present

## 2021-08-22 DIAGNOSIS — G8929 Other chronic pain: Secondary | ICD-10-CM

## 2021-08-22 NOTE — Patient Instructions (Addendum)
Meniscus Injury, Arthroscopy   Arthroscopy is a surgical procedure that involves the use of a small scope that has a camera and surgical instruments on the end (arthroscope). An arthroscope can be used to repair your meniscus injury.  LET YOUR HEALTH CARE PROVIDER KNOW ABOUT:  Any allergies you have.  All medicines you are taking, including vitamins, herbs, eyedrops, creams, and over-the-counter medicines.  Any recent colds or infections you have had or currently have.  Previous problems you or members of your family have had with the use of anesthetics.  Any blood disorders or blood clotting problems you have.  Previous surgeries you have had.  Medical conditions you have. RISKS AND COMPLICATIONS Generally, this is a safe procedure. However, as with any procedure, problems can occur. Possible problems include:  Damage to nerves or blood vessels.  Excess bleeding.  Blood clots.  Infection. BEFORE THE PROCEDURE  Do not eat or drink for 6-8 hours before the procedure.  Take medicines as directed by your surgeon. Ask your surgeon about changing or stopping your regular medicines.  You may have lab tests the morning of surgery. PROCEDURE  You will be given one of the following:   A medicine that numbs the area (local anesthesia).  A medicine that makes you go to sleep (general anesthesia).  A medicine injected into your spine that numbs your body below the waist (spinal anesthesia). Most often, several small cuts (incisions) are made in the knee. The arthroscope and instruments go into the incisions to repair the damage. The torn portion of the meniscus is removed.   AFTER THE PROCEDURE  You will be taken to the recovery area where your progress will be monitored. When you are awake, stable, and taking fluids without complications, you will be allowed to go home. This is usually the same day. A torn or stretched ligament (ligament sprain) may take 6-8 weeks to heal.   It  takes about the 4-6 WEEKS if your surgeon removed a torn meniscus.  A repaired meniscus may require 6-12 weeks of recovery time.  A torn ligament needing reconstructive surgery may take 6-12 months to heal fully.   This information is not intended to replace advice given to you by your health care provider. Make sure you discuss any questions you have with your health care provider. You have decided to proceed with operative arthroscopy of the knee. You have decided not to continue with nonoperative measures such as but not limited to oral medication, weight loss, activity modification, physical therapy, bracing, or injection.  We will perform operative arthroscopy of the knee. Some of the risks associated with arthroscopic surgery of the knee include but are not limited to Bleeding Infection Swelling Stiffness Blood clot Pain Need for knee replacement surgery    In compliance with recent [place] law in federal regulation regarding opioid use and abuse and addiction, we will taper (stop) opioid medication after 2 weeks.  If you're not comfortable with these risks and would like to continue with nonoperative treatment please let Dr. Harrison know prior to your surgery. 

## 2021-08-22 NOTE — Progress Notes (Signed)
FOLLOW UP   Encounter Diagnoses  Name Primary?   Chronic pain of left knee Yes   Degenerative tear of medial meniscus of left knee    Primary localized osteoarthritis of knee    Stress reaction of bone      Chief Complaint  Patient presents with   Knee Pain    LT knee Review MRI   Ht 5\' 10"  (1.778 m)   Wt 265 lb (120.2 kg)   BMI 38.02 kg/m   Past Medical History:  Diagnosis Date   Anxiety    Chest pain    a. 07/2017: cath showing no angiographically significant CAD with normal LV function and mild to moderate MR.   Depression    Sleep apnea    Thyroid disease    We had a long discussion of the results of his MRI he has stress reaction in the tibia and femur he has grade 4 arthritis patella and medial compartment with extruded macerated meniscus   He sold his house and bought a 5th wheel parked in the back of his sisters yard because she is sick and he was helping her  I told him he has a 60% chance of a knee replacement in 5 years if he gets a knee scope however, I cannot send him home to a 5th wheel to live in   I looked at his MRI with him  My interpretation is that he has OA grade 4 medial compartment stress reaction in the bone and a macerated meniscus he also has arthritis in the other 2 compartments  Plan is for arthroscopy left knee partial medial meniscectomy postop bracing

## 2021-08-24 ENCOUNTER — Telehealth: Payer: Self-pay | Admitting: Orthopedic Surgery

## 2021-08-24 NOTE — Telephone Encounter (Signed)
Patient came to our clinic requesting records re: left knee to be sent to [facility] clinic. Authorization form signed - aware of process.

## 2021-08-26 NOTE — Telephone Encounter (Signed)
As of Wednesday, 08/24/21, request was sent, per patient's signed release authorization form and per urgent request authorization from [facility] clinic.

## 2021-08-29 ENCOUNTER — Other Ambulatory Visit: Payer: Self-pay

## 2021-08-29 DIAGNOSIS — M23204 Derangement of unspecified medial meniscus due to old tear or injury, left knee: Secondary | ICD-10-CM

## 2021-08-31 ENCOUNTER — Other Ambulatory Visit: Payer: Self-pay | Admitting: Orthopedic Surgery

## 2021-08-31 DIAGNOSIS — Z01818 Encounter for other preprocedural examination: Secondary | ICD-10-CM

## 2021-08-31 DIAGNOSIS — M23204 Derangement of unspecified medial meniscus due to old tear or injury, left knee: Secondary | ICD-10-CM

## 2021-09-05 ENCOUNTER — Telehealth: Payer: Self-pay

## 2021-09-05 NOTE — Telephone Encounter (Signed)
Patient called stating admin at [facility] had called and asked about an authorization from the [place]. Stated admin was supposed to call him back 09/05/21 but so far he hasn't received a call. Please call patient back.

## 2021-09-05 NOTE — Patient Instructions (Signed)
Luis Randall  09/05/2021     @PREFPERIOPPHARMACY @   Your procedure is scheduled on  09/09/2021.   Report to [facility] at  0600  A.M.   Call this number if you have problems the morning of surgery:  [phone]   Remember:  Do not eat or drink after midnight.      Take these medicines the morning of surgery with A SIP OF WATER                                          thyroid, klonopin     Do not wear jewelry, make-up or nail polish.  Do not wear lotions, powders, or perfumes, or deodorant.  Do not shave 48 hours prior to surgery.  Men may shave face and neck.  Do not bring valuables to the hospital.  [facility] is not responsible for any belongings or valuables.  Contacts, dentures or bridgework may not be worn into surgery.  Leave your suitcase in the car.  After surgery it may be brought to your room.  For patients admitted to the hospital, discharge time will be determined by your treatment team.  Patients discharged the day of surgery will not be allowed to drive home and must have someone with them for 24 hours.    Special instructions:   DO NOT smoke tobacco or vape for 24 hours before your procedure.  Please read over the following fact sheets that you were given. Coughing and Deep Breathing, Surgical Site Infection Prevention, Anesthesia Post-op Instructions, and Care and Recovery After Surgery      Arthroscopic Knee Ligament Repair, Care After This sheet gives you information about how to care for yourself after your procedure. Your health care provider may also give you more specific instructions. If you have problems or questions, contact your health care provider. What can I expect after the procedure? After the procedure, it is common to have: Soreness or pain in your knee. Bruising and swelling on your knee, calf, and ankle for 3-4 days. A small amount of fluid coming from the incisions. Follow these instructions at home: Medicines Take  over-the-counter and prescription medicines only as told by your health care provider. Ask your health care provider if the medicine prescribed to you: Requires you to avoid driving or using machinery. Can cause constipation. You may need to take these actions to prevent or treat constipation: Drink enough fluid to keep your urine pale yellow. Take over-the-counter or prescription medicines. Eat foods that are high in fiber, such as beans, whole grains, and fresh fruits and vegetables. Limit foods that are high in fat and processed sugars, such as fried or sweet foods. If you have a brace or immobilizer: Wear it as told by your health care provider. Remove it only as told by your health care provider. Loosen it if your toes tingle, become numb, or turn cold and blue. Keep it clean and dry. Ask your health care provider when it is safe to drive. Bathing Do not take baths, swim, or use a hot tub until your health care provider approves. Keep your bandage (dressing) dry until your health care provider says that it can be removed. If the brace or immobilizer is not waterproof: Do not let it get wet. Cover it with a watertight covering when you take a bath or shower. Incision  care  Follow instructions from your health care provider about how to take care of your incisions. Make sure you: Wash your hands with soap and water for at least 20 seconds before and after you change your dressing. If soap and water are not available, use hand sanitizer. Change your dressing as told by your health care provider. Leave stitches (sutures), skin glue, or adhesive strips in place. These skin closures may need to stay in place for 2 weeks or longer. If adhesive strip edges start to loosen and curl up, you may trim the loose edges. Do not remove adhesive strips completely unless your health care provider tells you to do that. Check your incision areas every day for signs of infection. Check for: Redness. More  swelling or pain. Blood or more fluid. Warmth. Pus or a bad smell. Managing pain, stiffness, and swelling  If directed, put ice on the affected area. To do this: If you have a removable brace or immobilizer, remove it as told by your health care provider. Put ice in a plastic bag. Place a towel between your skin and the bag. Leave the ice on for 20 minutes, 2-3 times a day. Remove the ice if your skin turns bright red. This is very important. If you cannot feel pain, heat, or cold, you have a greater risk of damage to the area. Move your toes often to reduce stiffness and swelling. Raise (elevate) the injured area above the level of your heart while you are sitting or lying down. Activity Do not use your knee to support your body weight until your health care provider says that you can. Use crutches or other devices as told by your health care provider. Do physical therapy exercises as told by your health care provider. Physical therapy will help you regain movement and strength in your knee. Follow instructions from your health care provider about: When you may start motion exercises. When you may start riding a stationary bike and doing other low-impact activities. When you may start to jog and do other high-impact activities. Do not lift anything that is heavier than 10 lb (4.5 kg), or the limit that you are told, until your health care provider says that it is safe. Ask your health care provider what activities are safe for you. General instructions Do not use any products that contain nicotine or tobacco, such as cigarettes, e-cigarettes, and chewing tobacco. These can delay healing. If you need help quitting, ask your health care provider. Wear compression stockings as told by your health care provider. These stockings help to prevent blood clots and reduce swelling in your legs. Keep all follow-up visits. This is important. Contact a health care provider if: You have any of these  signs of infection: Redness around an incision. Blood or more fluid coming from an incision. Warmth coming from an incision. Pus or a bad smell coming from an incision. More swelling or pain in your knee. A fever or chills. You have pain that does not get better with medicine. Your incision opens up. Get help right away if: You have trouble breathing. You have chest pain. You have increased pain or swelling in your calf or at the back of your knee. You have numbness and tingling near the knee joint or in the foot, ankle, or toes. You notice that your foot or toes look darker than normal or are cooler than normal. These symptoms may represent a serious problem that is an emergency. Do not wait to see  if the symptoms will go away. Get medical help right away. Call your local emergency services (911 in the [place] Do not drive yourself to the hospital. Summary After the procedure, it is common to have knee pain with bruising and swelling on your knee, calf, and ankle. Icing your knee and raising your leg above the level of your heart will help control the pain and swelling. Do physical therapy exercises as told by your health care provider. Physical therapy will help you regain movement and strength in your knee. This information is not intended to replace advice given to you by your health care provider. Make sure you discuss any questions you have with your health care provider. Document Revised: 06/16/2019 Document Reviewed: 06/16/2019 Elsevier Patient Education  2023 Else[employer]. General Anesthesia, Adult, Care After The following information offers guidance on how to care for yourself after your procedure. Your health care provider may also give you more specific instructions. If you have problems or questions, contact your health care provider. What can I expect after the procedure? After the procedure, it is common for people to: Have pain or discomfort at the IV site. Have nausea or  vomiting. Have a sore throat or hoarseness. Have trouble concentrating. Feel cold or chills. Feel weak, sleepy, or tired (fatigue). Have soreness and body aches. These can affect parts of the body that were not involved in surgery. Follow these instructions at home: For the time period you were told by your health care provider:  Rest. Do not participate in activities where you could fall or become injured. Do not drive or use machinery. Do not drink alcohol. Do not take sleeping pills or medicines that cause drowsiness. Do not make important decisions or sign legal documents. Do not take care of children on your own. General instructions Drink enough fluid to keep your urine pale yellow. If you have sleep apnea, surgery and certain medicines can increase your risk for breathing problems. Follow instructions from your health care provider about wearing your sleep device: Anytime you are sleeping, including during daytime naps. While taking prescription pain medicines, sleeping medicines, or medicines that make you drowsy. Return to your normal activities as told by your health care provider. Ask your health care provider what activities are safe for you. Take over-the-counter and prescription medicines only as told by your health care provider. Do not use any products that contain nicotine or tobacco. These products include cigarettes, chewing tobacco, and vaping devices, such as e-cigarettes. These can delay incision healing after surgery. If you need help quitting, ask your health care provider. Contact a health care provider if: You have nausea or vomiting that does not get better with medicine. You vomit every time you eat or drink. You have pain that does not get better with medicine. You cannot urinate or have bloody urine. You develop a skin rash. You have a fever. Get help right away if: You have trouble breathing. You have chest pain. You vomit blood. These symptoms may be  an emergency. Get help right away. Call 911. Do not wait to see if the symptoms will go away. Do not drive yourself to the hospital. Summary After the procedure, it is common to have a sore throat, hoarseness, nausea, vomiting, or to feel weak, sleepy, or fatigue. For the time period you were told by your health care provider, do not drive or use machinery. Get help right away if you have difficulty breathing, have chest pain, or vomit blood. These symptoms  may be an emergency. This information is not intended to replace advice given to you by your health care provider. Make sure you discuss any questions you have with your health care provider. Document Revised: 04/15/2021 Document Reviewed: 04/15/2021 Elsevier Patient Education  2023 Elsevier Inc. How to Use Chlorhexidine Before Surgery Chlorhexidine gluconate (CHG) is a germ-killing (antiseptic) solution that is used to clean the skin. It can get rid of the bacteria that normally live on the skin and can keep them away for about 24 hours. To clean your skin with CHG, you may be given: A CHG solution to use in the shower or as part of a sponge bath. A prepackaged cloth that contains CHG. Cleaning your skin with CHG may help lower the risk for infection: While you are staying in the intensive care unit of the hospital. If you have a vascular access, such as a central line, to provide short-term or long-term access to your veins. If you have a catheter to drain urine from your bladder. If you are on a ventilator. A ventilator is a machine that helps you breathe by moving air in and out of your lungs. After surgery. What are the risks? Risks of using CHG include: A skin reaction. Hearing loss, if CHG gets in your ears and you have a perforated eardrum. Eye injury, if CHG gets in your eyes and is not rinsed out. The CHG product catching fire. Make sure that you avoid smoking and flames after applying CHG to your skin. Do not use CHG: If  you have a chlorhexidine allergy or have previously reacted to chlorhexidine. On babies younger than 532 months of age. How to use CHG solution Use CHG only as told by your health care provider, and follow the instructions on the label. Use the full amount of CHG as directed. Usually, this is one bottle. During a shower Follow these steps when using CHG solution during a shower (unless your health care provider gives you different instructions): Start the shower. Use your normal soap and shampoo to wash your face and hair. Turn off the shower or move out of the shower stream. Pour the CHG onto a clean washcloth. Do not use any type of brush or rough-edged sponge. Starting at your neck, lather your body down to your toes. Make sure you follow these instructions: If you will be having surgery, pay special attention to the part of your body where you will be having surgery. Scrub this area for at least 1 minute. Do not use CHG on your head or face. If the solution gets into your ears or eyes, rinse them well with water. Avoid your genital area. Avoid any areas of skin that have broken skin, cuts, or scrapes. Scrub your back and under your arms. Make sure to wash skin folds. Let the lather sit on your skin for 1-2 minutes or as long as told by your health care provider. Thoroughly rinse your entire body in the shower. Make sure that all body creases and crevices are rinsed well. Dry off with a clean towel. Do not put any substances on your body afterward--such as powder, lotion, or perfume--unless you are told to do so by your health care provider. Only use lotions that are recommended by the manufacturer. Put on clean clothes or pajamas. If it is the night before your surgery, sleep in clean sheets.  During a sponge bath Follow these steps when using CHG solution during a sponge bath (unless your health care provider  gives you different instructions): Use your normal soap and shampoo to wash your  face and hair. Pour the CHG onto a clean washcloth. Starting at your neck, lather your body down to your toes. Make sure you follow these instructions: If you will be having surgery, pay special attention to the part of your body where you will be having surgery. Scrub this area for at least 1 minute. Do not use CHG on your head or face. If the solution gets into your ears or eyes, rinse them well with water. Avoid your genital area. Avoid any areas of skin that have broken skin, cuts, or scrapes. Scrub your back and under your arms. Make sure to wash skin folds. Let the lather sit on your skin for 1-2 minutes or as long as told by your health care provider. Using a different clean, wet washcloth, thoroughly rinse your entire body. Make sure that all body creases and crevices are rinsed well. Dry off with a clean towel. Do not put any substances on your body afterward--such as powder, lotion, or perfume--unless you are told to do so by your health care provider. Only use lotions that are recommended by the manufacturer. Put on clean clothes or pajamas. If it is the night before your surgery, sleep in clean sheets. How to use CHG prepackaged cloths Only use CHG cloths as told by your health care provider, and follow the instructions on the label. Use the CHG cloth on clean, dry skin. Do not use the CHG cloth on your head or face unless your health care provider tells you to. When washing with the CHG cloth: Avoid your genital area. Avoid any areas of skin that have broken skin, cuts, or scrapes. Before surgery Follow these steps when using a CHG cloth to clean before surgery (unless your health care provider gives you different instructions): Using the CHG cloth, vigorously scrub the part of your body where you will be having surgery. Scrub using a back-and-forth motion for 3 minutes. The area on your body should be completely wet with CHG when you are done scrubbing. Do not rinse. Discard the  cloth and let the area air-dry. Do not put any substances on the area afterward, such as powder, lotion, or perfume. Put on clean clothes or pajamas. If it is the night before your surgery, sleep in clean sheets.  For general bathing Follow these steps when using CHG cloths for general bathing (unless your health care provider gives you different instructions). Use a separate CHG cloth for each area of your body. Make sure you wash between any folds of skin and between your fingers and toes. Wash your body in the following order, switching to a new cloth after each step: The front of your neck, shoulders, and chest. Both of your arms, under your arms, and your hands. Your stomach and groin area, avoiding the genitals. Your right leg and foot. Your left leg and foot. The back of your neck, your back, and your buttocks. Do not rinse. Discard the cloth and let the area air-dry. Do not put any substances on your body afterward--such as powder, lotion, or perfume--unless you are told to do so by your health care provider. Only use lotions that are recommended by the manufacturer. Put on clean clothes or pajamas. Contact a health care provider if: Your skin gets irritated after scrubbing. You have questions about using your solution or cloth. You swallow any chlorhexidine. Call your local poison control center (647-383-5811 in the [place]  Get help right away if: Your eyes itch badly, or they become very red or swollen. Your skin itches badly and is red or swollen. Your hearing changes. You have trouble seeing. You have swelling or tingling in your mouth or throat. You have trouble breathing. These symptoms may represent a serious problem that is an emergency. Do not wait to see if the symptoms will go away. Get medical help right away. Call your local emergency services (911 in the [place] Do not drive yourself to the hospital. Summary Chlorhexidine gluconate (CHG) is a germ-killing  (antiseptic) solution that is used to clean the skin. Cleaning your skin with CHG may help to lower your risk for infection. You may be given CHG to use for bathing. It may be in a bottle or in a prepackaged cloth to use on your skin. Carefully follow your health care provider's instructions and the instructions on the product label. Do not use CHG if you have a chlorhexidine allergy. Contact your health care provider if your skin gets irritated after scrubbing. This information is not intended to replace advice given to you by your health care provider. Make sure you discuss any questions you have with your health care provider. Document Revised: 05/16/2021 Document Reviewed: 03/29/2020 Elsevier Patient Education  2023 [employer].

## 2021-09-06 NOTE — Telephone Encounter (Signed)
The authorization is included in the referral

## 2021-09-07 ENCOUNTER — Encounter (HOSPITAL_COMMUNITY)
Admission: RE | Admit: 2021-09-07 | Discharge: 2021-09-07 | Disposition: A | Discharge status: Home / Self Care | Payer: No Typology Code available for payment source | Source: Ambulatory Visit | Attending: Orthopedic Surgery | Admitting: Orthopedic Surgery

## 2021-09-07 ENCOUNTER — Telehealth: Payer: Self-pay

## 2021-09-07 ENCOUNTER — Telehealth: Payer: Self-pay | Admitting: Radiology

## 2021-09-07 DIAGNOSIS — Z01818 Encounter for other preprocedural examination: Secondary | ICD-10-CM

## 2021-09-07 DIAGNOSIS — M23204 Derangement of unspecified medial meniscus due to old tear or injury, left knee: Secondary | ICD-10-CM

## 2021-09-07 NOTE — Telephone Encounter (Signed)
I discussed with patient, we are cancelling surgery for now he wants to file on [place] instead of his [place]BS I told him we can Reschedule when we get the referral packet from the [facility] hospital I will call him.   Okey Regal will let me know when the [place] paperwork arrives.   To you FYI

## 2021-09-07 NOTE — Telephone Encounter (Signed)
Left message for patient, hospital advised he is using his VA coverage for his upcoming knee surgery, if that is the case he needs to cancel surgery until he can have them send [place] approval to treat the knee which includes surgery  Currently we have been filing his BCBS if he files his BCBS we can leave as scheduled Asked him to call me back.

## 2021-09-07 NOTE — Telephone Encounter (Signed)
Ricka Burdock with [employer] called wanting to speak with you in reference to the surgery authorization for this patient's surgery on Friday. He can be reach at [phone] chose option O ext [phone] His email is [email or web address]

## 2021-09-07 NOTE — Telephone Encounter (Addendum)
I called again to BCBS to double check no precert needed since its federal wanted to double check and its not required. The ref number for the call is [id number] .  I emailed Fayrene Fearing in preservice department

## 2021-09-07 NOTE — Telephone Encounter (Signed)
Noted. Patient returned call; routed call to Amy.

## 2021-09-08 ENCOUNTER — Telehealth: Payer: Self-pay | Admitting: Radiology

## 2021-09-08 ENCOUNTER — Other Ambulatory Visit: Payer: Self-pay | Admitting: Orthopedic Surgery

## 2021-09-08 DIAGNOSIS — M23204 Derangement of unspecified medial meniscus due to old tear or injury, left knee: Secondary | ICD-10-CM

## 2021-09-08 NOTE — Telephone Encounter (Signed)
Left message for him to call back and let me know if Aug 15th is ok to RS surgery, scanned in the referral pages for the authorization

## 2021-09-09 ENCOUNTER — Encounter (HOSPITAL_COMMUNITY)
Admission: RE | Admit: 2021-09-09 | Discharge: 2021-09-09 | Disposition: A | Discharge status: Home / Self Care | Payer: No Typology Code available for payment source | Source: Ambulatory Visit | Attending: Orthopedic Surgery | Admitting: Orthopedic Surgery

## 2021-09-09 ENCOUNTER — Other Ambulatory Visit: Payer: Self-pay

## 2021-09-09 ENCOUNTER — Encounter (HOSPITAL_COMMUNITY): Payer: Self-pay

## 2021-09-12 NOTE — H&P (Signed)
Luis Randall is an 61 y.o. male.    Chief Complaint: Left knee pain   HPI: 61 year old male with left knee pain.  Patient has had treatment including injections including hyaluronic acid.  He has had a adequate trial of nonoperative treatment including activity modification oral medication and injection.  He decided to proceed with arthroscopic surgery versus total knee replacement  Past Medical History:  Diagnosis Date   Anxiety    Chest pain    a. 07/2017: cath showing no angiographically significant CAD with normal LV function and mild to moderate MR.   Depression    Sleep apnea    Thyroid disease     Past Surgical History:  Procedure Laterality Date   APPENDECTOMY  2006   ELBOW SURGERY Right 2010   release   ENDOSCOPIC VEIN LASER TREATMENT Bilateral 2005   LEFT HEART CATH AND CORONARY ANGIOGRAPHY N/A 08/06/2017   Procedure: LEFT HEART CATH AND CORONARY ANGIOGRAPHY;  Surgeon: Yvonne Kendall, MD;  Location: [facility] IN[facility];  Service: Cardiovascular;  Laterality: N/A;   SHOULDER SURGERY Right 2011   VARICOSE VEIN SURGERY Bilateral 1995    Family History  Problem Relation Age of Onset   Arthritis Mother    Varicose Veins Mother    Alcohol abuse Father    Tuberculosis Father    Colon cancer Paternal Grandfather        Maybe...versus pancreatic cancer   Social History:  reports that he has never smoked. He has never used smokeless tobacco. He reports that he does not drink alcohol and does not use drugs.  Allergies: No Known Allergies  No medications prior to admission.    No results found for this or any previous visit (from the past 48 hour(s)). No results found.  Review of Systems No current chest pain or shortness of breath  No malaise or fever  No abdominal pain    There were no vitals taken for this visit. Physical Exam   Constitutional: Vital signs- General appearance-normal  Cardiovascular swelling varicosities none, palpation of pulses  temperature warm without edema or tenderness  Lymph nodes negative  Skin normal  Neuro coordination, deep tendon reflexes, sensation normal  Psych alert and oriented x3, depression anxiety agitation none  Musculoskeletal gait abnormal  Left knee tenderness medial joint line small effusion, range of motion limited by pain but functional range of motion stability test normal strength and muscle tone normal no atrophy or tremor   Assessment/Plan X-ray left knee x 3   Pain chronic left knee   Patellofemoral joint shows peripheral osteophytes and joint space narrowing but no subluxation or tilt   The anterior alignment looks normal.  There is some mild narrowing of the medial compartment.   Impression primarily patellofemoral arthritis mild medial compartment narrowing      MRI report left knee   IMPRESSION: 1. Maceration of the posterior horn of the medial meniscus with peripheral meniscal extrusion. Degeneration of the body of the medial meniscus. 2. Tricompartmental cartilage abnormalities as described above, most severe in the medial femorotibial compartment and lateral patellofemoral compartment. 3. Bone marrow edema in the medial tibial metaphysis contiguous with the subchondral marrow edema, more than expected from cartilage loss. Although this may reflect severe subchondral marrow edema from cartilage loss with extension to the level of the metaphysis there is likely an element of underlying stress reaction.     Electronically Signed   By: Elige Ko M.D.   On: 08/18/2021 07:48  61 year old male chronic left knee pain failed conservative therapy x-ray and MRI revealed arthritis torn meniscus and subchondral marrow edema consistent with subchondral stress reaction  Patient is opted for surgical intervention with arthroscopy instead of knee replacement with the understanding there may need to be further surgery done later  Plan is for arthroscopy left knee  partial medial meniscectomy  Patient most likely will need some postop bracing for 6 to 8 weeks  Luis Canada, MD 09/12/2021, 11:57 AM

## 2021-09-13 ENCOUNTER — Encounter (HOSPITAL_COMMUNITY): Payer: Self-pay | Admitting: Orthopedic Surgery

## 2021-09-13 ENCOUNTER — Ambulatory Visit (HOSPITAL_BASED_OUTPATIENT_CLINIC_OR_DEPARTMENT_OTHER): Payer: No Typology Code available for payment source | Admitting: Certified Registered"

## 2021-09-13 ENCOUNTER — Telehealth: Payer: Self-pay | Admitting: Radiology

## 2021-09-13 ENCOUNTER — Ambulatory Visit (HOSPITAL_COMMUNITY)
Admission: RE | Admit: 2021-09-13 | Discharge: 2021-09-13 | Disposition: A | Discharge status: Home / Self Care | Payer: No Typology Code available for payment source | Attending: Orthopedic Surgery | Admitting: Orthopedic Surgery

## 2021-09-13 ENCOUNTER — Ambulatory Visit (HOSPITAL_COMMUNITY): Payer: No Typology Code available for payment source | Admitting: Certified Registered"

## 2021-09-13 ENCOUNTER — Encounter (HOSPITAL_COMMUNITY)
Admission: RE | Disposition: A | Discharge status: Home / Self Care | Payer: Self-pay | Source: Home / Self Care | Attending: Orthopedic Surgery

## 2021-09-13 ENCOUNTER — Other Ambulatory Visit: Payer: Self-pay

## 2021-09-13 DIAGNOSIS — S83249A Other tear of medial meniscus, current injury, unspecified knee, initial encounter: Secondary | ICD-10-CM

## 2021-09-13 DIAGNOSIS — E039 Hypothyroidism, unspecified: Secondary | ICD-10-CM | POA: Diagnosis not present

## 2021-09-13 DIAGNOSIS — G4730 Sleep apnea, unspecified: Secondary | ICD-10-CM | POA: Diagnosis not present

## 2021-09-13 DIAGNOSIS — S83232D Complex tear of medial meniscus, current injury, left knee, subsequent encounter: Secondary | ICD-10-CM | POA: Diagnosis not present

## 2021-09-13 DIAGNOSIS — X58XXXA Exposure to other specified factors, initial encounter: Secondary | ICD-10-CM | POA: Diagnosis not present

## 2021-09-13 DIAGNOSIS — F988 Other specified behavioral and emotional disorders with onset usually occurring in childhood and adolescence: Secondary | ICD-10-CM | POA: Insufficient documentation

## 2021-09-13 DIAGNOSIS — S83242A Other tear of medial meniscus, current injury, left knee, initial encounter: Secondary | ICD-10-CM | POA: Insufficient documentation

## 2021-09-13 DIAGNOSIS — M25762 Osteophyte, left knee: Secondary | ICD-10-CM | POA: Diagnosis not present

## 2021-09-13 DIAGNOSIS — S83232A Complex tear of medial meniscus, current injury, left knee, initial encounter: Secondary | ICD-10-CM

## 2021-09-13 HISTORY — PX: KNEE ARTHROSCOPY WITH MEDIAL MENISECTOMY: SHX5651

## 2021-09-13 SURGERY — ARTHROSCOPY, KNEE, WITH MEDIAL MENISCECTOMY
Anesthesia: General | Site: Knee | Laterality: Left

## 2021-09-13 MED ORDER — LACTATED RINGERS IV SOLN
INTRAVENOUS | Status: DC
Start: 2021-09-13 — End: 2021-09-13

## 2021-09-13 MED ORDER — PROMETHAZINE HCL 12.5 MG PO TABS: 12.5000 mg | ORAL_TABLET | Freq: Four times a day (QID) | ORAL | 0 refills | Status: AC | PRN

## 2021-09-13 MED ORDER — ONDANSETRON HCL 4 MG/2ML IJ SOLN
INTRAMUSCULAR | Status: DC | PRN
  Administered 2021-09-13: 4 mg via INTRAVENOUS

## 2021-09-13 MED ORDER — BUPIVACAINE-EPINEPHRINE (PF) 0.5% -1:200000 IJ SOLN
INTRAMUSCULAR | Status: DC | PRN
  Administered 2021-09-13: 60 mL via PERINEURAL

## 2021-09-13 MED ORDER — MIDAZOLAM HCL 2 MG/2ML IJ SOLN
INTRAMUSCULAR | Status: AC
  Filled 2021-09-13: qty 2

## 2021-09-13 MED ORDER — DEXAMETHASONE SODIUM PHOSPHATE 10 MG/ML IJ SOLN
INTRAMUSCULAR | Status: AC
  Filled 2021-09-13: qty 1

## 2021-09-13 MED ORDER — ENOXAPARIN SODIUM 40 MG/0.4ML IJ SOSY
40.0000 mg | PREFILLED_SYRINGE | Freq: Once | INTRAMUSCULAR | Status: AC
  Administered 2021-09-13: 40 mg via SUBCUTANEOUS
  Filled 2021-09-13: qty 0.4

## 2021-09-13 MED ORDER — LIDOCAINE 2% (20 MG/ML) 5 ML SYRINGE
INTRAMUSCULAR | Status: DC | PRN
  Administered 2021-09-13: 100 mg via INTRAVENOUS

## 2021-09-13 MED ORDER — HYDROCODONE-ACETAMINOPHEN 5-325 MG PO TABS
ORAL_TABLET | ORAL | Status: AC
  Filled 2021-09-13: qty 2

## 2021-09-13 MED ORDER — PROPOFOL 10 MG/ML IV BOLUS
INTRAVENOUS | Status: DC | PRN
  Administered 2021-09-13: 240 mg via INTRAVENOUS

## 2021-09-13 MED ORDER — EPINEPHRINE PF 1 MG/ML IJ SOLN
INTRAMUSCULAR | Status: AC
  Filled 2021-09-13: qty 4

## 2021-09-13 MED ORDER — SODIUM CHLORIDE 0.9 % IR SOLN: Status: DC | PRN

## 2021-09-13 MED ORDER — HYDROCODONE-ACETAMINOPHEN 10-325 MG PO TABS
1.0000 | ORAL_TABLET | ORAL | 0 refills | Status: AC | PRN
Start: 2021-09-13 — End: 2021-09-18

## 2021-09-13 MED ORDER — BUPIVACAINE-EPINEPHRINE (PF) 0.5% -1:200000 IJ SOLN
INTRAMUSCULAR | Status: AC
  Filled 2021-09-13: qty 60

## 2021-09-13 MED ORDER — PROPOFOL 10 MG/ML IV BOLUS
INTRAVENOUS | Status: AC
  Filled 2021-09-13: qty 20

## 2021-09-13 MED ORDER — ONDANSETRON HCL 4 MG/2ML IJ SOLN
INTRAMUSCULAR | Status: AC
  Filled 2021-09-13: qty 2

## 2021-09-13 MED ORDER — HYDROCODONE-ACETAMINOPHEN 10-325 MG PO TABS
1.0000 | ORAL_TABLET | Freq: Once | ORAL | Status: DC
  Administered 2021-09-13: 1 via ORAL

## 2021-09-13 MED ORDER — SUCCINYLCHOLINE CHLORIDE 200 MG/10ML IV SOSY
PREFILLED_SYRINGE | INTRAVENOUS | Status: AC
  Filled 2021-09-13: qty 20

## 2021-09-13 MED ORDER — FENTANYL CITRATE (PF) 100 MCG/2ML IJ SOLN
INTRAMUSCULAR | Status: DC | PRN
  Administered 2021-09-13: 25 ug via INTRAVENOUS
  Administered 2021-09-13: 50 ug via INTRAVENOUS
  Administered 2021-09-13: 25 ug via INTRAVENOUS
  Administered 2021-09-13: 50 ug via INTRAVENOUS

## 2021-09-13 MED ORDER — DEXAMETHASONE SODIUM PHOSPHATE 10 MG/ML IJ SOLN
INTRAMUSCULAR | Status: DC | PRN
  Administered 2021-09-13: 5 mg via INTRAVENOUS

## 2021-09-13 MED ORDER — CEFAZOLIN IN SODIUM CHLORIDE 3-0.9 GM/100ML-% IV SOLN
3.0000 g | INTRAVENOUS | Status: AC
  Administered 2021-09-13: 3 g via INTRAVENOUS
  Filled 2021-09-13: qty 100

## 2021-09-13 MED ORDER — KETOROLAC TROMETHAMINE 30 MG/ML IJ SOLN
INTRAMUSCULAR | Status: DC | PRN
  Administered 2021-09-13: 30 mg via INTRAVENOUS

## 2021-09-13 MED ORDER — HYDROMORPHONE HCL 1 MG/ML IJ SOLN: 0.2500 mg | INTRAMUSCULAR | Status: DC | PRN

## 2021-09-13 MED ORDER — HYDROCODONE-ACETAMINOPHEN 5-325 MG PO TABS: 2.0000 | ORAL_TABLET | Freq: Once | ORAL | Status: DC

## 2021-09-13 MED ORDER — MEPERIDINE HCL 50 MG/ML IJ SOLN: 6.2500 mg | INTRAMUSCULAR | Status: DC | PRN

## 2021-09-13 MED ORDER — ONDANSETRON HCL 4 MG/2ML IJ SOLN
4.0000 mg | Freq: Once | INTRAMUSCULAR | Status: DC | PRN
Start: 2021-09-13 — End: 2021-09-13

## 2021-09-13 MED ORDER — SODIUM CHLORIDE 0.9 % IR SOLN
Status: DC | PRN
  Administered 2021-09-13 (×3): 3000 mL

## 2021-09-13 MED ORDER — CHLORHEXIDINE GLUCONATE 0.12 % MT SOLN: 15.0000 mL | Freq: Once | OROMUCOSAL | Status: DC

## 2021-09-13 MED ORDER — FENTANYL CITRATE (PF) 250 MCG/5ML IJ SOLN
INTRAMUSCULAR | Status: AC
  Filled 2021-09-13: qty 5

## 2021-09-13 MED ORDER — ORAL CARE MOUTH RINSE: 15.0000 mL | Freq: Once | OROMUCOSAL | Status: DC

## 2021-09-13 MED ORDER — MIDAZOLAM HCL 5 MG/5ML IJ SOLN
INTRAMUSCULAR | Status: DC | PRN
  Administered 2021-09-13: 2 mg via INTRAVENOUS

## 2021-09-13 MED ORDER — DEXAMETHASONE SODIUM PHOSPHATE 10 MG/ML IJ SOLN
10.0000 mg | Freq: Once | INTRAMUSCULAR | Status: AC
Start: 2021-09-13 — End: 2021-09-13
  Administered 2021-09-13: 10 mg via INTRAVENOUS
  Filled 2021-09-13: qty 1

## 2021-09-13 MED ORDER — CEFAZOLIN SODIUM-DEXTROSE 2-4 GM/100ML-% IV SOLN
INTRAVENOUS | Status: AC
  Filled 2021-09-13: qty 100

## 2021-09-13 SURGICAL SUPPLY — 46 items
ABLATOR ASPIRATE 50D MULTI-PRT (SURGICAL WAND) IMPLANT
APL PRP STRL LF DISP 70% ISPRP (MISCELLANEOUS) ×1
BAG HAMPER (MISCELLANEOUS) ×1 IMPLANT
BLADE SHAVER TORPEDO 4X13 (MISCELLANEOUS) IMPLANT
BNDG CMPR STD VLCR NS LF 5.8X6 (GAUZE/BANDAGES/DRESSINGS) ×1
BNDG ELASTIC 6X5.8 VLCR NS LF (GAUZE/BANDAGES/DRESSINGS) ×1 IMPLANT
BURR OVAL 8 FLU 4.0X13 (MISCELLANEOUS) IMPLANT
CHLORAPREP W/TINT 26 (MISCELLANEOUS) ×1 IMPLANT
CLOTH BEACON ORANGE TIMEOUT ST (SAFETY) ×1 IMPLANT
COOLER ICEMAN CLASSIC (MISCELLANEOUS) ×1 IMPLANT
CUTTER BONE 4.0MM X 13CM (MISCELLANEOUS) IMPLANT
DECANTER SPIKE VIAL GLASS SM (MISCELLANEOUS) ×2 IMPLANT
DRAPE HALF SHEET 40X57 (DRAPES) ×1 IMPLANT
GAUZE SPONGE 4X4 12PLY STRL (GAUZE/BANDAGES/DRESSINGS) ×1 IMPLANT
GAUZE XEROFORM 5X9 LF (GAUZE/BANDAGES/DRESSINGS) ×1 IMPLANT
GLOVE BIO SURGEON STRL SZ7 (GLOVE) IMPLANT
GLOVE BIOGEL PI IND STRL 7.0 (GLOVE) ×2 IMPLANT
GLOVE BIOGEL PI INDICATOR 7.0 (GLOVE) ×2
GLOVE SS N UNI LF 8.5 STRL (GLOVE) ×1 IMPLANT
GLOVE SURG POLYISO LF SZ8 (GLOVE) ×1 IMPLANT
GOWN STRL REUS W/TWL LRG LVL3 (GOWN DISPOSABLE) ×1 IMPLANT
GOWN STRL REUS W/TWL XL LVL3 (GOWN DISPOSABLE) ×1 IMPLANT
IV NS IRRIG 3000ML ARTHROMATIC (IV SOLUTION) ×2 IMPLANT
KIT BLADEGUARD II DBL (SET/KITS/TRAYS/PACK) ×1 IMPLANT
KIT TURNOVER CYSTO (KITS) ×1 IMPLANT
MANIFOLD NEPTUNE II (INSTRUMENTS) ×1 IMPLANT
MARKER SKIN DUAL TIP RULER LAB (MISCELLANEOUS) ×1 IMPLANT
NDL HYPO 18GX1.5 BLUNT FILL (NEEDLE) ×1 IMPLANT
NDL HYPO 21X1.5 SAFETY (NEEDLE) ×1 IMPLANT
NEEDLE HYPO 18GX1.5 BLUNT FILL (NEEDLE) ×1 IMPLANT
NEEDLE HYPO 21X1.5 SAFETY (NEEDLE) ×1 IMPLANT
PACK ARTHRO LIMB DRAPE STRL (MISCELLANEOUS) ×1 IMPLANT
PACK ARTHROSCOPY WL (CUSTOM PROCEDURE TRAY) ×1 IMPLANT
PAD ABD 5X9 TENDERSORB (GAUZE/BANDAGES/DRESSINGS) ×1 IMPLANT
PAD ARMBOARD 7.5X6 YLW CONV (MISCELLANEOUS) ×1 IMPLANT
PAD COLD SHLDR SM WRAP-ON (PAD) IMPLANT
PAD FOR LEG HOLDER (MISCELLANEOUS) ×1 IMPLANT
PADDING CAST COTTON 6X4 STRL (CAST SUPPLIES) ×1 IMPLANT
PORT APPOLLO RF 90DEGREE MULTI (SURGICAL WAND) IMPLANT
PROBE HOOK APOLLO (SURGICAL WAND) IMPLANT
SET ARTHROSCOPY INST (INSTRUMENTS) ×1 IMPLANT
SET BASIN LINEN APH (SET/KITS/TRAYS/PACK) ×1 IMPLANT
SUT ETHILON 3 0 FSL (SUTURE) ×1 IMPLANT
SYR 10ML LL (SYRINGE) ×1 IMPLANT
SYR 30ML LL (SYRINGE) ×2 IMPLANT
TUBING IN/OUT FLOW W/MAIN PUMP (TUBING) ×1 IMPLANT

## 2021-09-13 NOTE — Anesthesia Procedure Notes (Signed)
Procedure Name: LMA Insertion Date/Time: 09/13/2021 12:32 PM  Performed by: Marny Lowenstein, CRNAPre-anesthesia Checklist: Patient identified, Emergency Drugs available, Suction available and Patient being monitored Patient Re-evaluated:Patient Re-evaluated prior to induction Oxygen Delivery Method: Circle system utilized Preoxygenation: Pre-oxygenation with 100% oxygen Induction Type: IV induction Ventilation: Mask ventilation without difficulty LMA: LMA inserted LMA Size: 4.0 Number of attempts: 1 Airway Equipment and Method: Patient positioned with wedge pillow Placement Confirmation: positive ETCO2 and breath sounds checked- equal and bilateral Tube secured with: Tape Dental Injury: Teeth and Oropharynx as per pre-operative assessment  Comments: Pt with loose implant, upper left. Discussed placement of LMA and potential for further damage. Pt aware and ok to proceed. Dentition intact after LMA placement.

## 2021-09-13 NOTE — Op Note (Signed)
09/13/2021   1:43 PM   PATIENT:  Luis Randall  61 y.o. male   PRE-OPERATIVE DIAGNOSIS:  torn medial meniscus left knee   POST-OPERATIVE DIAGNOSIS:  torn medial meniscus left knee   PROCEDURE:  Procedure(s): KNEE ARTHROSCOPY WITH MEDIAL MENISECTOMY (Left)   Findings at surgery starting with the medial compartment   Medial compartment: Posterior horn medial meniscus tear, the meniscus looks like it had either been torn before or had prior resection there was grade 3 arthritis of the medial femoral condyle.  The meniscal tear was oblique right at the junction of the body and posterior horn   Anterior and notch area large osteophyte at the ACL insertion   ACL PCL intact   Lateral compartment: Fissure in the lateral plateau running from the anterior to posterior portion no tears in the meniscus condyle normal    Patellofemoral joint: Lateral patella and lateral trochlear region grade 3 and 4 arthritis   Suprapatellar pouch: Synovitis     SURGEON:  Surgeon(s) and Role:    * Vickki Hearing, MD - Primary  PHYSICIAN ASSISTANT:   ASSISTANTS: No assistance  ANESTHESIA:   general  EBL:  min   BLOOD ADMINISTERED:none  DRAINS: none   LOCAL MEDICATIONS USED:  MARCAINE     SPECIMEN:  No Specimen  DISPOSITION OF SPECIMEN:  N/A  COUNTS:  YES  TOURNIQUET:  * No tourniquets in log *  DICTATION: .Dragon Dictation  PLAN OF CARE: Discharge to home after PACU  PATIENT DISPOSITION:  PACU - hemodynamically stable.   Delay start of Pharmacological VTE agent (>24hrs) due to surgical blood loss or risk of bleeding: not applicable  Note was just made aware patient planning on driving 6 hours to [place]  Patient will be advised to take aspirin twice a day and giving him a shot of Lovenox and placing him on TED hose and advised him to do ankle pumps every hour  Details of procedure  The patient was seen in the preop area reevaluated and cleared for surgery.  His left leg  was confirmed as the surgical site and marked as such.  Chart review was completed along with review of imaging studies  The patient was taken to the operating room for general anesthesia with LMA  He was on operating table in the supine position we placed his left leg in a arthroscopic Stryker leg holder and the right leg was placed on a padded well leg holder  After sterile prep and drape and timeout I made a standard lateral portal I placed the scope in the joint I performed a diagnostic arthroscopy and then used a spinal needle to place a medial portal.  I repeated the diagnostic portion with the probe  We found a medial meniscal tear and deficient medial meniscus posterior horn the tear was at the junction of the body and posterior horn and this was resected with a straight meniscal biter and then the fragments of the meniscus were removed with a shaver the meniscus was trimmed and balanced with a shaver and a 50 and 90 degree ArthroCare wand.  There was an extensive [employer] synovitis which had to be controlled with cautery as a lot of it had to be removed just for improved visualization  After the meniscus was probed and found to be stable we turned our attention to the large anterior osteophyte we used a bone cutter and a bur to bur down this osteophyte.  We then proceeded to perform a brief  synovectomy of the suprapatellar pouch  We irrigated the joint and closed with 3-0 nylon sutures  We injected a total of 60 cc of Marcaine with epinephrine we applied sterile dressings and a Cryo/Cuff.  The patient was extubated and taken to recovery room in stable condition  Plan is for him to take aspirin twice a day as he is going on a trip to [place] 6 hours He will get a dose of Lovenox here He will be placed in TED hose He will be advised to do ankle pumps every hour He should use his walker until I see him he can be weight-bear as tolerated

## 2021-09-13 NOTE — Anesthesia Preprocedure Evaluation (Signed)
Anesthesia Evaluation  Patient identified by MRN, date of birth, ID band Patient awake    Reviewed: Allergy & Precautions, NPO status , Patient's Chart, lab work & pertinent test results  Airway Mallampati: II  TM Distance: >3 FB Neck ROM: Full    Dental  (+) Dental Advisory Given, Loose, Implants,    Pulmonary sleep apnea ,    Pulmonary exam normal breath sounds clear to auscultation       Cardiovascular negative cardio ROS Normal cardiovascular exam Rhythm:Regular Rate:Normal     Neuro/Psych PSYCHIATRIC DISORDERS Anxiety Depression Bipolar Disorder negative neurological ROS     GI/Hepatic negative GI ROS, Neg liver ROS,   Endo/Other  Hypothyroidism   Renal/GU negative Renal ROS  negative genitourinary   Musculoskeletal negative musculoskeletal ROS (+)   Abdominal   Peds negative pediatric ROS (+) ATTENTION DEFICIT DISORDER WITHOUT HYPERACTIVITY Hematology negative hematology ROS (+)   Anesthesia Other Findings   Reproductive/Obstetrics negative OB ROS                            Anesthesia Physical Anesthesia Plan  ASA: 2  Anesthesia Plan: General   Post-op Pain Management: Dilaudid IV   Induction: Intravenous  PONV Risk Score and Plan: 3 and Ondansetron and Dexamethasone  Airway Management Planned: LMA  Additional Equipment:   Intra-op Plan:   Post-operative Plan: Extubation in OR  Informed Consent: I have reviewed the patients History and Physical, chart, labs and discussed the procedure including the risks, benefits and alternatives for the proposed anesthesia with the patient or authorized representative who has indicated his/her understanding and acceptance.     Dental advisory given  Plan Discussed with: CRNA and Surgeon  Anesthesia Plan Comments:        Anesthesia Quick Evaluation

## 2021-09-13 NOTE — Addendum Note (Signed)
Addendum  created 09/13/21 1505 by Molli Barrows, MD   Clinical Note Signed

## 2021-09-13 NOTE — Brief Op Note (Signed)
09/13/2021   1:43 PM   PATIENT:  Luis Randall  61 y.o. male   PRE-OPERATIVE DIAGNOSIS:  torn medial meniscus left knee   POST-OPERATIVE DIAGNOSIS:  torn medial meniscus left knee   PROCEDURE:  Procedure(s): KNEE ARTHROSCOPY WITH MEDIAL MENISECTOMY (Left)   Findings at surgery starting with the medial compartment   Medial compartment: Posterior horn medial meniscus tear, the meniscus looks like it had either been torn before or had prior resection there was grade 3 arthritis of the medial femoral condyle.  The meniscal tear was oblique right at the junction of the body and posterior horn   Anterior and notch area large osteophyte at the ACL insertion   ACL PCL intact   Lateral compartment: Fissure in the lateral plateau running from the anterior to posterior portion no tears in the meniscus condyle normal    Patellofemoral joint: Lateral patella and lateral trochlear region grade 3 and 4 arthritis   Suprapatellar pouch: Synovitis     SURGEON:  Surgeon(s) and Role:    * Tayla Panozzo E, MD - Primary  PHYSICIAN ASSISTANT:   ASSISTANTS: No assistance  ANESTHESIA:   general  EBL:  min   BLOOD ADMINISTERED:none  DRAINS: none   LOCAL MEDICATIONS USED:  MARCAINE     SPECIMEN:  No Specimen  DISPOSITION OF SPECIMEN:  N/A  COUNTS:  YES  TOURNIQUET:  * No tourniquets in log *  DICTATION: .Dragon Dictation  PLAN OF CARE: Discharge to home after PACU  PATIENT DISPOSITION:  PACU - hemodynamically stable.   Delay start of Pharmacological VTE agent (>24hrs) due to surgical blood loss or risk of bleeding: not applicable  Note was just made aware patient planning on driving 6 hours to [place]  Patient will be advised to take aspirin twice a day and giving him a shot of Lovenox and placing him on TED hose and advised him to do ankle pumps every hour  Details of procedure  The patient was seen in the preop area reevaluated and cleared for surgery.  His left leg  was confirmed as the surgical site and marked as such.  Chart review was completed along with review of imaging studies  The patient was taken to the operating room for general anesthesia with LMA  He was on operating table in the supine position we placed his left leg in a arthroscopic Stryker leg holder and the right leg was placed on a padded well leg holder  After sterile prep and drape and timeout I made a standard lateral portal I placed the scope in the joint I performed a diagnostic arthroscopy and then used a spinal needle to place a medial portal.  I repeated the diagnostic portion with the probe  We found a medial meniscal tear and deficient medial meniscus posterior horn the tear was at the junction of the body and posterior horn and this was resected with a straight meniscal biter and then the fragments of the meniscus were removed with a shaver the meniscus was trimmed and balanced with a shaver and a 50 and 90 degree ArthroCare wand.  There was an extensive [employer] synovitis which had to be controlled with cautery as a lot of it had to be removed just for improved visualization  After the meniscus was probed and found to be stable we turned our attention to the large anterior osteophyte we used a bone cutter and a bur to bur down this osteophyte.  We then proceeded to perform a brief   sutures  We injected a total of 60 cc of Marcaine with epinephrine we applied sterile dressings and a Cryo/Cuff.  The patient was extubated and taken to recovery room in stable condition  Plan is for him to take aspirin twice a day as he is going on a trip to [place] 6 hours He will get a dose of Lovenox here He will be placed in TED hose He will be advised to do ankle pumps every hour He should use his walker until I see him he can be weight-bear as tolerated

## 2021-09-13 NOTE — Anesthesia Postprocedure Evaluation (Addendum)
Anesthesia Post Note  Patient: Luis Randall  Procedure(s) Performed: KNEE ARTHROSCOPY WITH MEDIAL MENISECTOMY (Left: Knee)  Patient location during evaluation: Phase II Anesthesia Type: General Level of consciousness: awake and alert and oriented Pain management: pain level controlled Vital Signs Assessment: post-procedure vital signs reviewed and stable Respiratory status: spontaneous breathing, nonlabored ventilation and respiratory function stable Cardiovascular status: blood pressure returned to baseline and stable Postop Assessment: no apparent nausea or vomiting Anesthetic complications: no Comments: Episodes of bradycardia, appeared to be from malfunctioned lead wires. Patient has no chest pain or SOB, BP and Spo2 stable. Patient will be discharged home.   No notable events documented.   Last Vitals:  Vitals:   09/13/21 1342 09/13/21 1345  BP: 123/89 (!) 123/90  Pulse: 80 85  Resp: 12 14  Temp: 36.4 C   SpO2: 95% 95%    Last Pain:  Vitals:   09/13/21 1408  TempSrc:   PainSc: 7                  [name] C Lonna Rabold

## 2021-09-13 NOTE — Interval H&P Note (Signed)
History and Physical Interval Note:  09/13/2021 12:17 PM  Luis Randall  has presented today for surgery, with the diagnosis of torn medial meniscus left knee.  The various methods of treatment have been discussed with the patient and family. After consideration of risks, benefits and other options for treatment, the patient has consented to  Procedure(s): KNEE ARTHROSCOPY WITH MEDIAL MENISECTOMY (Left) as a surgical intervention.  The patient's history has been reviewed, patient examined, no change in status, stable for surgery.  I have reviewed the patient's chart and labs.  Questions were answered to the patient's satisfaction.     [name]

## 2021-09-13 NOTE — Brief Op Note (Signed)
09/13/2021  1:37 PM  PATIENT:  Luis Randall  61 y.o. male  PRE-OPERATIVE DIAGNOSIS:  torn medial meniscus left knee  POST-OPERATIVE DIAGNOSIS:  torn medial meniscus left knee  PROCEDURE:  Procedure(s): KNEE ARTHROSCOPY WITH MEDIAL MENISECTOMY (Left)  Findings at surgery starting with the medial compartment  Medial compartment: Posterior horn medial meniscus tear, the meniscus looks like it had either been torn before or had prior resection there was grade 3 arthritis of the medial femoral condyle.  The meniscal tear was oblique right at the junction of the body and posterior horn  Anterior and notch area large osteophyte at the ACL insertion  ACL PCL intact  Lateral compartment: Fissure in the lateral plateau running from the anterior to posterior portion no tears in the meniscus condyle normal   Patellofemoral joint: Lateral patella and lateral trochlear region grade 3 and 4 arthritis  Suprapatellar pouch: Synovitis  SURGEON:  Surgeon(s) and Role:    * Vickki Hearing, MD - Primary  PHYSICIAN ASSISTANT:   ASSISTANTS: No assistance  ANESTHESIA:   general  EBL:  min   BLOOD ADMINISTERED:none  DRAINS: none   LOCAL MEDICATIONS USED:  MARCAINE     SPECIMEN:  No Specimen  DISPOSITION OF SPECIMEN:  N/A  COUNTS:  YES  TOURNIQUET:  * No tourniquets in log *  DICTATION: .Dragon Dictation  PLAN OF CARE: Discharge to home after PACU  PATIENT DISPOSITION:  PACU - hemodynamically stable.   Delay start of Pharmacological VTE agent (>24hrs) due to surgical blood loss or risk of bleeding: not applicable  Note was just made aware patient planning on driving 6 hours to [place]  Patient will be advised to take aspirin twice a day and giving him a shot of Lovenox and placing him on TED hose and advised him to do ankle pumps every hour  Details of procedure  The patient was seen in the preop area reevaluated and cleared for surgery.  His left leg was confirmed as  the surgical site and marked as such.  Chart review was completed along with review of imaging studies  The patient was taken to the operating room for general anesthesia with LMA  He was on operating table in the supine position we placed his left leg in a arthroscopic Stryker leg holder and the right leg was placed on a padded well leg holder  After sterile prep and drape and timeout I made a standard lateral portal I placed the scope in the joint I performed a diagnostic arthroscopy and then used a spinal needle to place a medial portal.  I repeated the diagnostic portion with the probe  We found a medial meniscal tear and deficient medial meniscus posterior horn the tear was at the junction of the body and posterior horn and this was resected with a straight meniscal biter and then the fragments of the meniscus were removed with a shaver the meniscus was trimmed and balanced with a shaver and a 50 and 90 degree ArthroCare wand.  There was an extensive [employer] synovitis which had to be controlled with cautery as a lot of it had to be removed just for improved visualization  After the meniscus was probed and found to be stable we turned our attention to the large anterior osteophyte we used a bone cutter and a bur to bur down this osteophyte.  We then proceeded to perform a brief synovectomy of the suprapatellar pouch  We irrigated the joint and closed with 3-0 nylon  sutures  We injected a total of 60 cc of Marcaine with epinephrine we applied sterile dressings and a Cryo/Cuff.  The patient was extubated and taken to recovery room in stable condition  Plan is for him to take aspirin twice a day as he is going on a trip to [place] 6 hours He will get a dose of Lovenox here He will be placed in TED hose He will be advised to do ankle pumps every hour He should use his walker until I see him he can be weight-bear as tolerated

## 2021-09-13 NOTE — Transfer of Care (Signed)
Immediate Anesthesia Transfer of Care Note  Patient: Luis Randall  Procedure(s) Performed: KNEE ARTHROSCOPY WITH MEDIAL MENISECTOMY (Left: Knee)  Patient Location: PACU  Anesthesia Type:General  Level of Consciousness: awake, alert  and oriented  Airway & Oxygen Therapy: Patient Spontanous Breathing  Post-op Assessment: Report given to RN and Post -op Vital signs reviewed and stable  Post vital signs: Reviewed and stable  Last Vitals:  Vitals Value Taken Time  BP 123/90 09/13/21 1345  Temp 36.4 C 09/13/21 1342  Pulse 76 09/13/21 1347  Resp 10 09/13/21 1347  SpO2 94 % 09/13/21 1347  Vitals shown include unvalidated device data.  Last Pain:  Vitals:   09/13/21 1342  TempSrc:   PainSc: 7       Patients Stated Pain Goal: 5 (09/13/21 1138)  Complications: No notable events documented.

## 2021-09-13 NOTE — Telephone Encounter (Signed)
Dr Rexene Edison wants to see him Friday at 10 am  Can you put him on the schedule? I will call patient

## 2021-09-14 NOTE — Telephone Encounter (Signed)
Patient said not this Friday, next so I moved it To you FYI 09/23/21 1150.

## 2021-09-15 NOTE — Telephone Encounter (Signed)
?,   OK

## 2021-09-16 ENCOUNTER — Encounter (HOSPITAL_COMMUNITY): Payer: Self-pay | Admitting: Orthopedic Surgery

## 2021-09-16 ENCOUNTER — Encounter: Payer: No Typology Code available for payment source | Admitting: Orthopedic Surgery

## 2021-09-23 ENCOUNTER — Ambulatory Visit (INDEPENDENT_AMBULATORY_CARE_PROVIDER_SITE_OTHER): Payer: No Typology Code available for payment source | Admitting: Orthopedic Surgery

## 2021-09-23 ENCOUNTER — Encounter: Payer: Self-pay | Admitting: Orthopedic Surgery

## 2021-09-23 DIAGNOSIS — Z9889 Other specified postprocedural states: Secondary | ICD-10-CM | POA: Insufficient documentation

## 2021-09-23 NOTE — Progress Notes (Signed)
Chief Complaint  Patient presents with   Routine Post Op    S/p LEFT knee scope DOS 09/13/21 Remove sutures and fit for playmaker   Encounter Diagnosis  Name Primary?   S/P left knee arthroscopy 09/13/21 Yes   PRE-OPERATIVE DIAGNOSIS:  torn medial meniscus left knee   POST-OPERATIVE DIAGNOSIS:  torn medial meniscus left knee   PROCEDURE:  Procedure(s): KNEE ARTHROSCOPY WITH MEDIAL MENISECTOMY (Left)   Findings at surgery starting with the medial compartment   Medial compartment: Posterior horn medial meniscus tear, the meniscus looks like it had either been torn before or had prior resection there was grade 3 arthritis of the medial femoral condyle.  The meniscal tear was oblique right at the junction of the body and posterior horn   Anterior and notch area large osteophyte at the ACL insertion   ACL PCL intact   Lateral compartment: Fissure in the lateral plateau running from the anterior to posterior portion no tears in the meniscus condyle normal    Patellofemoral joint: Lateral patella and lateral trochlear region grade 3 and 4 arthritis   Suprapatellar pouch: Synovitis    Luis Randall did well on his trip to [place].  He says his knee feels very good.  He has no signs of DVT.  Calf is soft.  Knee range 5-1 05.  Hold on playmaker brace  Start knee quadriceps exercises follow-up 3 weeks

## 2021-09-23 NOTE — Patient Instructions (Signed)
Ice the knee twice a day  Home exercises daily  Use Tylenol 500 mg every 6 for pain as needed  Okay to add ibuprofen 800 mg every 8 if needed.

## 2021-10-17 ENCOUNTER — Encounter: Payer: No Typology Code available for payment source | Admitting: Orthopedic Surgery

## 2021-10-21 ENCOUNTER — Telehealth: Payer: Self-pay | Admitting: Orthopedic Surgery

## 2021-10-21 NOTE — Telephone Encounter (Signed)
Patient called relaying he is still in [place] - and will be for another month; may he have his post op visit (Wed, 10/26/21) virtually?   States he is doing well. Please advise.

## 2021-10-21 NOTE — Telephone Encounter (Signed)
yes

## 2021-10-24 NOTE — Telephone Encounter (Signed)
Called patient notified.

## 2021-10-26 ENCOUNTER — Ambulatory Visit (INDEPENDENT_AMBULATORY_CARE_PROVIDER_SITE_OTHER): Payer: Federal, State, Local not specified - PPO | Admitting: Orthopedic Surgery

## 2021-10-26 DIAGNOSIS — Z9889 Other specified postprocedural states: Secondary | ICD-10-CM

## 2021-10-26 DIAGNOSIS — I872 Venous insufficiency (chronic) (peripheral): Secondary | ICD-10-CM | POA: Insufficient documentation

## 2021-10-26 DIAGNOSIS — Z5189 Encounter for other specified aftercare: Secondary | ICD-10-CM

## 2021-10-26 NOTE — Progress Notes (Signed)
Chief Complaint Patient presents with  Routine Post Op/      S/p LEFT knee scope DOS 09/13/21 Remove sutures and fit for playmaker  Sometimes hard to get started   Swelling no   1-10 scale pain is a 4 intermittently  Walking fine, [employer] was good but felt tired   REQUESTS A KNEE BRACE.  THIGH CIRCUMFERENCE IS  23 INCHES     6 INCHES ABOVE HIS PATELLA   (POTENTIALLY NIECE COULD PICK UP).  CONTACT [facility] REF BRACE   Virtual Visit via Video Note  I connected with [name] on 10/26/21 at 11:30 AM EDT by a video enabled telemedicine application and verified that I am speaking with the correct person using two identifiers.  Location: Patient: Luis Randall  Provider: [facility]    I discussed the limitations of evaluation and management by telemedicine and the availability of in person appointments. The patient expressed understanding and agreed to proceed.     I discussed the assessment and treatment plan with the patient. The patient was provided an opportunity to ask questions and all were answered. The patient agreed with the plan and demonstrated an understanding of the instructions.   The patient was advised to call back or seek an in-person evaluation if the symptoms worsen or if the condition fails to improve as anticipated.  I provided 8 minutes of non-face-to-face time during this encounter.   Arther Abbott, MD

## 2021-10-31 ENCOUNTER — Telehealth: Payer: Self-pay | Admitting: Orthopedic Surgery

## 2021-10-31 NOTE — Telephone Encounter (Signed)
I called him back  [facility] prost[facility] can get him the brace faxed order for him.   Fax [phone]

## 2021-10-31 NOTE — Telephone Encounter (Signed)
Patient called left voicemail at 10:10 is returning Amy call

## 2021-10-31 NOTE — Telephone Encounter (Signed)
Patient called to follow up on the knee brace which Dr Aline Brochure discussed at time of phone visit 10/26/21: States he is planning to stay in [place]. If brace is ordered and to be mailed, he will let us know what address in [place] to send.  (Per chart note) "REQUESTS A KNEE BRACE.  THIGH CIRCUMFERENCE IS  23 INCHES     6 INCHES ABOVE HIS PATELLA".

## 2021-10-31 NOTE — Telephone Encounter (Signed)
I can't mail him a brace to [place] I have to fit him for it make sure it fits and have him sign permission to [employer]. He also asked about billing [facility] for the brace I told him if he wants through the [place] I will have to send order there. He does not want to do that states will take too long He will check online and see if he can find a brace he likes if not he will make a trip here to get one.   To you FYI

## 2021-10-31 NOTE — Telephone Encounter (Signed)
Voice message received from patient with a provider to send a brace order as discussed earlier today: Please fax to [facility] in [place], [place]: [place] #[phone]

## 2021-11-01 ENCOUNTER — Telehealth: Payer: Self-pay | Admitting: Radiology

## 2021-11-01 NOTE — Telephone Encounter (Signed)
I called him we discussed he said what they showed him he could buy over the counter for $50 I told him its same as we have and yes, he can get it much cheaper over the counter, he voiced understanding.

## 2021-11-01 NOTE — Telephone Encounter (Signed)
Patient wants you to call him.  He called, said the knee brace that they gave him is no more than a knee sleeve he can get in a store.  He said they told him that is what the Rx we sent called for.

## 2021-11-01 NOTE — Telephone Encounter (Signed)
I sent in an order for a hinged knee brace.

## 2021-11-01 NOTE — Telephone Encounter (Signed)
It. Is . Done. Thanks

## 2021-11-07 ENCOUNTER — Telehealth: Payer: Self-pay | Admitting: Radiology

## 2021-11-07 NOTE — Telephone Encounter (Signed)
Patient called and asked if we found the paperwork for the [place].  Sending to [facility] staff first, not sure who needs this message.

## 2021-11-07 NOTE — Telephone Encounter (Signed)
I have notified patient; aware to pick up.

## 2021-11-08 ENCOUNTER — Telehealth: Payer: Self-pay | Admitting: Radiology

## 2021-11-08 NOTE — Telephone Encounter (Signed)
I notified patient yesterday, 11/07/21, of the paperwork. If it is to be mailed, I can send it to address noted.

## 2021-11-08 NOTE — Telephone Encounter (Signed)
Patient called and asked that you mail the [place] form for [place] brace [place]  [address] He is at his son's house now, no longer in [place].

## 2022-03-30 ENCOUNTER — Encounter: Payer: Self-pay | Admitting: Radiology

## 2022-11-29 ENCOUNTER — Encounter: Payer: Self-pay | Admitting: Orthopedic Surgery

## 2023-01-16 ENCOUNTER — Other Ambulatory Visit: Payer: Self-pay | Admitting: Orthopedic Surgery

## 2023-01-16 NOTE — Telephone Encounter (Signed)
Dr. Mort Sawyers pt - spoke w/the patient, he is requesting a refill for Ibuprofen 800mg , 90 tablets, every 8hrs prn to be sent to [facility], [address], [place], [place] [phone], he is traveling.

## 2023-01-17 MED ORDER — IBUPROFEN 800 MG PO TABS: 800.0000 mg | ORAL_TABLET | Freq: Three times a day (TID) | ORAL | 5 refills | Status: AC | PRN
